# Patient Record
Sex: Female | Born: 2000 | Race: White | Hispanic: No | Marital: Single | Smoking: Current every day smoker
Health system: Southern US, Community
[De-identification: ages and names within clinical notes are randomized; demographics above are authoritative.]

## PROBLEM LIST (undated history)

## (undated) DIAGNOSIS — F329 Major depressive disorder, single episode, unspecified: Secondary | ICD-10-CM

## (undated) DIAGNOSIS — N94819 Vulvodynia, unspecified: Secondary | ICD-10-CM

## (undated) DIAGNOSIS — R000 Tachycardia, unspecified: Secondary | ICD-10-CM

## (undated) DIAGNOSIS — F419 Anxiety disorder, unspecified: Secondary | ICD-10-CM

## (undated) DIAGNOSIS — F32A Depression, unspecified: Secondary | ICD-10-CM

## (undated) DIAGNOSIS — G43909 Migraine, unspecified, not intractable, without status migrainosus: Secondary | ICD-10-CM

## (undated) DIAGNOSIS — I951 Orthostatic hypotension: Secondary | ICD-10-CM

## (undated) DIAGNOSIS — K219 Gastro-esophageal reflux disease without esophagitis: Secondary | ICD-10-CM

## (undated) DIAGNOSIS — T50901A Poisoning by unspecified drugs, medicaments and biological substances, accidental (unintentional), initial encounter: Secondary | ICD-10-CM

## (undated) DIAGNOSIS — K2970 Gastritis, unspecified, without bleeding: Secondary | ICD-10-CM

## (undated) DIAGNOSIS — L709 Acne, unspecified: Secondary | ICD-10-CM

## (undated) DIAGNOSIS — R42 Dizziness and giddiness: Secondary | ICD-10-CM

## (undated) DIAGNOSIS — N946 Dysmenorrhea, unspecified: Secondary | ICD-10-CM

## (undated) HISTORY — DX: Dizziness and giddiness: R42

## (undated) HISTORY — DX: Migraine, unspecified, not intractable, without status migrainosus: G43.909

## (undated) HISTORY — DX: Vulvodynia, unspecified: N94.819

## (undated) HISTORY — DX: Orthostatic hypotension: I95.1

## (undated) HISTORY — DX: Gastro-esophageal reflux disease without esophagitis: K21.9

## (undated) HISTORY — DX: Acne, unspecified: L70.9

## (undated) HISTORY — DX: Poisoning by unspecified drugs, medicaments and biological substances, accidental (unintentional), initial encounter: T50.901A

## (undated) HISTORY — DX: Dysmenorrhea, unspecified: N94.6

## (undated) HISTORY — DX: Major depressive disorder, single episode, unspecified: F32.9

## (undated) HISTORY — DX: Tachycardia, unspecified: R00.0

## (undated) HISTORY — DX: Depression, unspecified: F32.A

## (undated) HISTORY — DX: Gastritis, unspecified, without bleeding: K29.70

## (undated) HISTORY — DX: Anxiety disorder, unspecified: F41.9

---
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
## 2019-02-24 ENCOUNTER — Encounter: Payer: Self-pay | Admitting: Family Medicine

## 2019-02-24 ENCOUNTER — Telehealth: Payer: Self-pay | Admitting: *Deleted

## 2019-02-24 NOTE — Telephone Encounter (Signed)
Patient called and left me a message on my voicemail asking if you were the correct provider that would be able to provide the information on her ADHD. She is registering for disability services for her ADHD at [employer]. She needs to provide documentation of medication, diagnosis, etc and what accomodations she is asking for. She said [facility] to [facility] but had her records sent here and you did take over her care-please advise.

## 2019-02-24 NOTE — Telephone Encounter (Signed)
I would be the correct person.  As far as I know, she doesn't have other accommodations, just the medications, which were discussed in detail at her last visit.

## 2019-02-24 NOTE — Telephone Encounter (Signed)
Patient sent form, I am bringing back to you.

## 2019-02-25 ENCOUNTER — Other Ambulatory Visit: Payer: Self-pay | Admitting: Family Medicine

## 2019-02-25 ENCOUNTER — Encounter: Payer: Self-pay | Admitting: Family Medicine

## 2019-02-25 DIAGNOSIS — F988 Other specified behavioral and emotional disorders with onset usually occurring in childhood and adolescence: Secondary | ICD-10-CM

## 2019-02-25 MED ORDER — AMPHETAMINE-DEXTROAMPHET ER 5 MG PO CP24: 5.0000 mg | ORAL_CAPSULE | Freq: Every day | ORAL | 0 refills | Status: DC

## 2019-02-25 MED ORDER — AMPHETAMINE-DEXTROAMPHET ER 10 MG PO CP24: 10.0000 mg | ORAL_CAPSULE | Freq: Every day | ORAL | 0 refills | Status: DC

## 2019-02-25 NOTE — Telephone Encounter (Signed)
Adderall does not show up under PDMP.

## 2019-02-25 NOTE — Telephone Encounter (Signed)
Correction, I was able to get into the site.  Last refill 11/20/18 #30 Dextroamp-Amphet ER 10 mg

## 2019-02-25 NOTE — Telephone Encounter (Signed)
Shelley Foster, please look into this as I do not have access to PDMP review

## 2019-02-25 NOTE — Telephone Encounter (Signed)
Refills were done. But for some reason, her pharmacy doesn't show up in the [place] review.  Any idea why?  This seems important that I have this information, and it is a [facility], so I'm not sure why it doesn't show up.  Thanks for looking into this or forwarding to someone who can.

## 2019-02-25 NOTE — Telephone Encounter (Signed)
Is this okay to refill? 

## 2019-05-05 ENCOUNTER — Other Ambulatory Visit: Payer: Self-pay

## 2019-05-05 ENCOUNTER — Encounter: Payer: Self-pay | Admitting: Family Medicine

## 2019-05-05 ENCOUNTER — Ambulatory Visit: Payer: BC Managed Care – PPO | Admitting: Family Medicine

## 2019-05-05 VITALS — BP 110/70 | HR 88 | Temp 96.0°F | Ht 63.0 in | Wt 103.6 lb

## 2019-05-05 DIAGNOSIS — Z23 Encounter for immunization: Secondary | ICD-10-CM

## 2019-05-05 DIAGNOSIS — F988 Other specified behavioral and emotional disorders with onset usually occurring in childhood and adolescence: Secondary | ICD-10-CM

## 2019-05-05 DIAGNOSIS — R634 Abnormal weight loss: Secondary | ICD-10-CM

## 2019-05-05 DIAGNOSIS — R636 Underweight: Secondary | ICD-10-CM

## 2019-05-05 DIAGNOSIS — R350 Frequency of micturition: Secondary | ICD-10-CM

## 2019-05-05 DIAGNOSIS — Z681 Body mass index (BMI) 19 or less, adult: Secondary | ICD-10-CM

## 2019-05-05 DIAGNOSIS — F1290 Cannabis use, unspecified, uncomplicated: Secondary | ICD-10-CM

## 2019-05-05 LAB — POCT URINALYSIS DIP (PROADVANTAGE DEVICE)
Bilirubin, UA: NEGATIVE
Blood, UA: NEGATIVE
Glucose, UA: NEGATIVE mg/dL
Ketones, POC UA: NEGATIVE mg/dL
Leukocytes, UA: NEGATIVE
Nitrite, UA: NEGATIVE
Protein Ur, POC: NEGATIVE mg/dL
Specific Gravity, Urine: 1.015
Urobilinogen, Ur: NEGATIVE
pH, UA: 6 (ref 5.0–8.0)

## 2019-05-05 NOTE — Progress Notes (Signed)
Chief Complaint  Patient presents with  . Weight Loss    having issues with weightloss, thinks adderall was contributing. Started Ophelia Charter PM (new med) , Dr. Marisue Brooklyn rxing.     Patient presents with concern of weight loss.  She thinks she has decreased appetite, and forgets to eat.  Sometimes she gets distracted (goes to get something to eat, gets distracted and forgets), related to ADD. She noticed decrease in appetite a lot with the adderall. She was just recently changed to [place] PM, taken for a week, and hasn't been in class yet. She started on lowest dose, may need to increase. This is taken the night before, and so far hasn't upset her stomach like the adderall did.  She has adderall (and other older ADD meds) on hand, in case she forgets to take it the night before (ie if she has exam, and didn't remember to take medicine the night before).   She has been home over break, and reports moods have been much better than other times when home for extended periods--helps knowing she has a date to return to school.  She adopted a dog, who loves being home and getting more attention. She will continue to take classes online, lives in apartment. She has been more active since getting her dog--walking up and down 4 flights of stairs, walking between 30-60 minutes most days.  She previously used to walk a lot all over campus, but was less active until she got her dog.  Routine at home is different, only eat meals together infrequently, as sometimes dinner isn't ready until late, when she has already eaten a sandwich earlier.    At school, she was getting meals to go (from sorority)--by the time she gets the food home, it isn't as hot, doesn't taste as good. She has gotten into a routine of eating in the morning--eats yogurt or oatmeal if has an appetite, if not, she will have a protein shake until her stomach feels better to eat more. She is trying to have a regular routine of eating when she feeds  her dog (reminds her to feed herself too).  Has nuts and snacks at home ready to eat, even if she isn't hungry or craving anything.  Some change to bowels--sometimes will have 5 bowel movements in the morning, some small, some large.  Pattern has been stable for a while. Has IUD, rare spotting. No changes to hair, skin, nails, moods  She voids frequently--drinks a lot of water, and Diet Coke (followed by water). Smokes marijuana daily. Helps with appetite.  PMH, PSH, SH reviewed  Outpatient Encounter Medications as of 05/05/2019  Medication Sig Note  . Ascorbic Acid (VITAMIN C) 100 MG CHEW Chew 1 each by mouth daily.   . Calcium-Phosphorus-Vitamin D (CALCIUM GUMMIES PO) Take 1 each by mouth daily.   . Ferrous Sulfate (IRON) 325 (65 Fe) MG TABS Take 1 tablet by mouth daily.   Marland Kitchen FIBER ADULT GUMMIES PO Take 1 each by mouth daily.   . JORNAY PM 20 MG CP24 Take 1 capsule by mouth at bedtime.   Marland Kitchen levonorgestrel (MIRENA) 20 MCG/24HR IUD 1 each by Intrauterine route once.   . Multiple Vitamins-Minerals (MULTIVITAMIN WITH MINERALS) tablet Take 1 tablet by mouth daily.   . Probiotic Product (PROBIOTIC DAILY PO) Take 1 capsule by mouth daily.   . Amphetamine Sulfate (EVEKEO) 10 MG TABS Take 1 tablet by mouth as needed.   Marland Kitchen amphetamine-dextroamphetamine (ADDERALL XR) 10 MG 24 hr  capsule Take 1 capsule (10 mg total) by mouth daily. (Patient not taking: Reported on 05/05/2019)   . amphetamine-dextroamphetamine (ADDERALL XR) 5 MG 24 hr capsule Take 1 capsule (5 mg total) by mouth daily. (Patient not taking: Reported on 05/05/2019)   . naproxen sodium (ANAPROX DS) 550 MG tablet 1 tab po q 12 hours prn pain (Patient not taking: Reported on 05/05/2019) 09/15/2015: Uses prn menstrual cramps  . [DISCONTINUED] amphetamine-dextroamphetamine (ADDERALL XR) 10 MG 24 hr capsule Take 1 capsule (10 mg total) by mouth daily.   . [DISCONTINUED] amphetamine-dextroamphetamine (ADDERALL XR) 10 MG 24 hr capsule Take 1 capsule (10  mg total) by mouth daily.   . [DISCONTINUED] amphetamine-dextroamphetamine (ADDERALL XR) 5 MG 24 hr capsule Take 1 capsule (5 mg total) by mouth daily.   . [DISCONTINUED] amphetamine-dextroamphetamine (ADDERALL XR) 5 MG 24 hr capsule Take 1 capsule (5 mg total) by mouth daily.    No facility-administered encounter medications on file as of 05/05/2019.   Allergies  Allergen Reactions  . Lactase Nausea Only   ROS: no fever, chills, URI symptoms, fatigue, hair/skin/nail changes.  Bowels per HPI.  No palpitations/tachycardia, chest pain.  +decreased appetite at times.   +ADD. Denies depression/anxiety.  See HPI   PHYSICAL EXAM:  BP 110/70   Pulse 88   Temp (!) 96 F (35.6 C) (Other (Comment))   Ht 5\' 3"  (1.6 m)   Wt 103 lb 9.6 oz (47 kg)   BMI 18.35 kg/m   Wt Readings from Last 3 Encounters:  05/05/19 103 lb 9.6 oz (47 kg) (7 %, Z= -1.51)*  12/03/18 129 lb 12.8 oz (58.9 kg) (55 %, Z= 0.12)*  11/20/18 131 lb (59.4 kg) (57 %, Z= 0.18)*   * Growth percentiles are based on CDC (Girls, 2-20 Years) data.   Pleasant, well-appearing, thin female, in good spirits, in no distress HEENT: conjunctiva and sclera are clear, EOMI. Wearing mask Neck; no lymphadenopathy, thyromegaly or mass Heart: regular rate and rhythm, no murmur Lungs: clear bilaterally Back: No spinal or CVA tenderness Abdomen: soft, nontender, no mass Extremities: no edema Psych: normal mood, affect, hygiene and grooming Neuro: alert and oriented, normal gait.  ASSESSMENT/PLAN:   Weight loss - suspect related to SE of meds, inadequate intake. r/o thyroid dz. Given polyuria, r/o DM - Plan: TSH, Glucose  Urinary frequency - suspect related to water and caffeine intake. given wt loss, r/o glucosuria/DM - Plan: Glucose, POCT Urinalysis DIP (Proadvantage Device)  Attention deficit disorder (ADD) without hyperactivity - meds changed per Dr. Johnnye Sima, to be titrated once classes start  Need for influenza vaccination -  Plan: Flu Vaccine QUAD 6+ mos PF IM (Fluarix Quad PF)  Body mass index (BMI) 19.9 or less, adult  Underweight - counseled extensively re: adequate caloric intake, healthy diet, reminders to eat even if not hungry. Weight-bearing exercise recommended  Marijuana use, continuous - not encouraged to use for appetite at this point (not reason she uses, but helps)   30 min visit, more than 1/2 spent counseling. Weight bearing exercise encouraged (no longer walking around campus with heavy backpack), discussed potential increased risk for weaker bones.  Denies any body image issues. Discussed goal weight (110-130) Pt will be returning to school, so unable to arrange for f/u.

## 2019-05-05 NOTE — Patient Instructions (Signed)
We are testing your thyroid and for diabetes today. Please be sure to eat every 2-3 hours, whether or not you feel hungry. Have it be nutritious foods, including protein and carbs, and fruits, vegetables. Use the shakes if you don't eat much, or as additional supplements (ie afternoon snack). Try and get weight-bearing exercise (you may have lost some muscle since not carrying a backpack, and also because your bones are at high risk for thinning when you're underweight).

## 2019-05-06 LAB — GLUCOSE, RANDOM: Glucose: 73 mg/dL (ref 65–99)

## 2019-05-06 LAB — TSH: TSH: 1.09 u[IU]/mL (ref 0.450–4.500)

## 2019-07-15 ENCOUNTER — Encounter: Payer: Self-pay | Admitting: Family Medicine

## 2019-07-15 NOTE — Patient Instructions (Addendum)
Have a happy birthday!!!  We are checking your blood today, and giving you specimen containers to evaluate the stool for eggs and worms/parasites. Not sure if the photo was truly a worm or not, and if so, which one.  I'm giving information on tapeworm, just since you have lost weight (though you haven't continued to lose weight, so I'm thinking maybe there are other contributing factors). Based on the stool studies, we will decide of an anti-parasitic medication is needed.  Please keep [place] updated on any further abnormalities in your stool that you note, and save it if you see it again.    Tapeworm Infection Tapeworms are parasites that can live in your intestines. When eggs from a tapeworm are consumed, the eggs develop into a young tapeworm (larva) and eventually an adult tapeworm inside of the body. An adult tapeworm can grow very long and can live inside of a human for many years. Tapeworm infection is rare in the [place]. There are several types of tapeworms. Most tapeworm infections cause only mild symptoms and are limited to the intestines. One type of tapeworm, called a pork tapeworm (Taenia solium or T. solium), can cause a more serious infection (cysticercosis). In this type of infection, tapeworm larvae can spread through the body and form cysts in areas such as the eyes, skin, muscle tissue, heart, and brain. What are the causes? This condition is caused by:  Eating beef, pork, or fish that is raw or has not been cooked well enough.  Drinking water that is contaminated with tapeworm eggs.  Eating food that is contaminated with tapeworm eggs. What increases the risk? You are more likely to develop this condition if:  You live in or travel to places where livestock roam freely, such as rural, developing countries.  You work with animals or are exposed to animals.  You live in a household with someone who has a tapeworm infection.  You eat food that has been prepared by  someone with a tapeworm infection. What are the signs or symptoms? In some cases, there are no symptoms. If you have symptoms, they may include:  Pain in the abdomen.  Nausea.  Loss of appetite.  Weight loss.  Worm segments in your stool (feces).  Diarrhea. Symptoms of cysticercosis vary depending on where the cysts form in your body. Symptoms may include:  Feeling lumps under your skin.  Headache.  Seizure.  Confusion.  Loss of vision.  Muscle weakness.  Balance problems.  Eye pain. How is this diagnosed? This condition may be diagnosed based on:  Your symptoms.  Your travel history.  Your diet history.  Blood tests.  Stool tests.  Tests on the fluid that surrounds the brain and spinal cord (cerebrospinal fluid, CSF). The fluid is removed with a needle during a spinal tap (lumbar puncture).  Imaging tests, such as a CT scan or MRI. How is this treated? Treatment for this condition depends on the type of tapeworm and your symptoms. Most often, this condition is treated with antiparasitic medicine to kill the tapeworms. Treatment for cysticercosis depends on the number of cysts and the symptoms you have. Treatment may include:  Medicines to: ? Kill tapeworm larvae or eggs. ? Decrease swelling (steroids). ? Prevent seizures (anti-epileptics).  Surgery to remove cysts. Follow these instructions at home:   Take over-the-counter and prescription medicines only as told by your health care provider.  If you were prescribed an antiparasitic medicine, take it as told by your health care  provider. Do not stop taking the antiparasitic even if you start to feel better.  Keep all follow-up visits as told by your health care provider. This is important.  Wash your hands often with soap and water. If soap and water are not available, use hand sanitizer. How is this prevented?  Do not eat raw or undercooked fish or meat. Cook fish and meat according to food  safety guidelines. Use a meat thermometer to make sure that fish and meat are cooked to the recommended temperatures.  Wash your hands with soap and water: ? After you use the toilet. ? Before you handle or prepare food. ? After you handle or prepare food.  Freeze meat for 12 hours before cooking it to help prevent tapeworm infection. Only eat raw fish (such as sushi) that has been previously frozen.  Before you eat raw fruits and vegetables: ? Wash them in boiled, bottled, or treated water. ? Peel them.  When traveling in developing countries: ? Make sure you only drink water that is bottled or treated. ? Do not eat or drink anything that may be contaminated, including beverages with ice cubes that may have been made from unboiled or untreated water. ? Do not eat raw foods that have been washed in unboiled tap water. ? It is safe to drink bottled or canned beverages, such as carbonated beverages, teas, pasteurized fruit drinks, or steaming hot beverages. Contact a health care provider if you:  Still have symptoms of tapeworm infection after your treatment is complete.  Develop any new symptoms. Get help right away if you:  Have a seizure.  Have sudden vision loss.  Feel light-headed or you faint.  Become confused. Summary  Tapeworms are parasites that can live in your intestines. Tapeworms can live in a human body for many years.  Tapeworms develop from tapeworm eggs that are consumed. This can occur from eating beef, pork, or fish that is raw or has not been cooked well enough, or from eating food or drinking water that is contaminated with tapeworm eggs.  Most tapeworm infections cause only mild symptoms and are limited to the intestines. One type of tapeworm can cause a more serious infection (cysticercosis) where the larvae spread through the body and form cysts.  Most often, this condition is treated with antiparasitic medicine to kill the tapeworms. This information is  not intended to replace advice given to you by your health care provider. Make sure you discuss any questions you have with your health care provider. Document Revised: 08/08/2018 Document Reviewed: 05/25/2017 Elsevier Patient Education  2020 [employer].

## 2019-07-15 NOTE — Progress Notes (Signed)
Chief Complaint  Patient presents with  . Consult    found worm in her bowel movement yesterday. Had BM today and was normal. Was seen in [place] at [facility] 2/28 and 3/5 for low grade fever and fatigue. Had GI symptoms only after given abx. Has had mucousy BM's for alst few months.     Patient presents for evaluation of possible worm in her stool.  See photo sent via MyChart yesterday, which looks like a worm on toilet paper.  Given that she has had unexplained weight loss (other than related to ADHD meds and possibly inadequate intake), I felt like we should further evaluate this in the context of possible worm (tapeworm vs other).  She happens to be home from college in [place] this week.  She reports recent illness--went to [facility] 2/28 with low grade fever (100.5 or less), fatigue, hot/cold. Negative covid and flu tests.  No other URI symptoms.  Given rx for 10d of doxy, which caused nausea and stomach upset . Went to a different UC on 3/5, retested for COVID, flu, mono, strep, checked blood and urine--all was reportedly normal. Told she could stop ABX after 7 days.  Given nausea med and told to take claritin (had some PND). She admits that she smoked a little more marijuana--may have contributed to ST, but that had helped her stomach and nausea.  Mother came and got her, brought her home. Since home, she has rested, feels better.  Still more tired than normal. Since passing the foreign object in yesterday's stool, denies further problems.  No further abnormal stools.  Denies abdominal pain (occasionally feels bloated, if she eats the wrong thing, which she relates to not having her gall bladder anymore). Denies fever/chills (resolved).  Checking weights at home; at home (undressed) ranges 100-102, trying to eat more, even if not as healthy. Hasn't had weight loss since her last visit.  Dog owner--yes Ingestion of contaminated water? No, drinks filtered or bottled water Eating/handling undercooked fish or  crustaceans? Only eats good sushi Eating/handling undercooked meat? No No travel or camping.   PMH, PSH, SH reviewed  Outpatient Encounter Medications as of 07/16/2019  Medication Sig Note  . Ascorbic Acid (VITAMIN C) 100 MG CHEW Chew 1 each by mouth daily.   . Calcium-Phosphorus-Vitamin D (CALCIUM GUMMIES PO) Take 1 each by mouth daily.   Marland Kitchen FIBER ADULT GUMMIES PO Take 1 each by mouth daily.   . JORNAY PM 100 MG CP24 Take 1 capsule by mouth at bedtime.   Marland Kitchen levonorgestrel (MIRENA) 20 MCG/24HR IUD 1 each by Intrauterine route once.   . methylphenidate (METADATE CD) 40 MG CR capsule Take 40 mg by mouth every morning.   . Multiple Vitamins-Minerals (MULTIVITAMIN WITH MINERALS) tablet Take 1 tablet by mouth daily.   . Probiotic Product (PROBIOTIC DAILY PO) Take 1 capsule by mouth daily.   . Amphetamine Sulfate (EVEKEO) 10 MG TABS Take 1 tablet by mouth as needed.   . doxycycline (MONODOX) 100 MG capsule Take 100 mg by mouth every 12 (twelve) hours.   . Ferrous Sulfate (IRON) 325 (65 Fe) MG TABS Take 1 tablet by mouth daily.   . naproxen sodium (ANAPROX DS) 550 MG tablet 1 tab po q 12 hours prn pain (Patient not taking: Reported on 05/05/2019) 09/15/2015: Uses prn menstrual cramps  . ondansetron (ZOFRAN-ODT) 4 MG disintegrating tablet Take 4 mg by mouth every 6 (six) hours as needed.   . [DISCONTINUED] amphetamine-dextroamphetamine (ADDERALL XR) 10 MG 24 hr capsule  Take 1 capsule (10 mg total) by mouth daily. (Patient not taking: Reported on 05/05/2019)   . [DISCONTINUED] amphetamine-dextroamphetamine (ADDERALL XR) 5 MG 24 hr capsule Take 1 capsule (5 mg total) by mouth daily. (Patient not taking: Reported on 05/05/2019)   . [DISCONTINUED] JORNAY PM 20 MG CP24 Take 1 capsule by mouth at bedtime.    No facility-administered encounter medications on file as of 07/16/2019.   Allergies  Allergen Reactions  . Lactase Nausea Only   ROS: recent illness with fever, fatigue, nausea.  Currently feels well,  some residual fatigue. No nausea, vomiting, diarrhea, bleeding, bruising, rash, URI complaints or other concerns.   PHYSICAL EXAM:  BP 100/70   Pulse 68   Temp (!) 97.3 F (36.3 C) (Other (Comment))   Ht 5' 3.5" (1.613 m)   Wt 104 lb 3.2 oz (47.3 kg)   BMI 18.17 kg/m   Wt Readings from Last 3 Encounters:  07/16/19 104 lb 3.2 oz (47.3 kg) (7 %, Z= -1.47)*  05/05/19 103 lb 9.6 oz (47 kg) (7 %, Z= -1.51)*  12/03/18 129 lb 12.8 oz (58.9 kg) (55 %, Z= 0.12)*   * Growth percentiles are based on CDC (Girls, 2-20 Years) data.   Well-appearing, pleasant, thin female, in no distress HEENT: conjunctiva and sclera are clear, anicteric Neck: no lymphadenopathy, thyromegaly or mass Heart: regular rate and rhythm Lungs: clear bilaterally Abdomen: normal bowel sounds, soft, nontender, no mass Extremities; no edema Skin: normal turgor, no rash Psych: normal mood, affect, hygiene and grooming Neuro: alert and oriented, normal gait   ASSESSMENT/PLAN:  Worms in stool - Recent sginificant wt loss, now stable: check for O&P prior to presumptive treatment, ?tapeworm vs other. - Plan: CBC with Differential/Platelet, Comprehensive metabolic panel, Ova and Parasite Examination, Ova and Parasite Examination, Ova and Parasite Examination, Ova and Parasite Examination   Stool for O&P x 3 CBC and c-met

## 2019-07-16 ENCOUNTER — Other Ambulatory Visit: Payer: Self-pay

## 2019-07-16 ENCOUNTER — Encounter: Payer: Self-pay | Admitting: Family Medicine

## 2019-07-16 ENCOUNTER — Ambulatory Visit: Payer: BC Managed Care – PPO | Admitting: Family Medicine

## 2019-07-16 VITALS — BP 100/70 | HR 68 | Temp 97.3°F | Ht 63.5 in | Wt 104.2 lb

## 2019-07-16 DIAGNOSIS — B839 Helminthiasis, unspecified: Secondary | ICD-10-CM

## 2019-07-17 ENCOUNTER — Other Ambulatory Visit: Payer: BC Managed Care – PPO

## 2019-07-17 LAB — COMPREHENSIVE METABOLIC PANEL
ALT: 16 IU/L (ref 0–32)
AST: 19 IU/L (ref 0–40)
Albumin/Globulin Ratio: 1.8 (ref 1.2–2.2)
Albumin: 4.8 g/dL (ref 3.9–5.0)
Alkaline Phosphatase: 73 IU/L (ref 39–117)
BUN/Creatinine Ratio: 15 (ref 9–23)
BUN: 9 mg/dL (ref 6–20)
Bilirubin Total: 0.4 mg/dL (ref 0.0–1.2)
CO2: 26 mmol/L (ref 20–29)
Calcium: 9.9 mg/dL (ref 8.7–10.2)
Chloride: 100 mmol/L (ref 96–106)
Creatinine, Ser: 0.6 mg/dL (ref 0.57–1.00)
GFR calc Af Amer: 153 mL/min/{1.73_m2} (ref >59)
GFR calc non Af Amer: 133 mL/min/{1.73_m2} (ref >59)
Globulin, Total: 2.6 g/dL (ref 1.5–4.5)
Glucose: 77 mg/dL (ref 65–99)
Potassium: 5 mmol/L (ref 3.5–5.2)
Sodium: 139 mmol/L (ref 134–144)
Total Protein: 7.4 g/dL (ref 6.0–8.5)

## 2019-07-17 LAB — CBC WITH DIFFERENTIAL/PLATELET
Basophils Absolute: 0.1 10*3/uL (ref 0.0–0.2)
Basos: 1 %
EOS (ABSOLUTE): 0.1 10*3/uL (ref 0.0–0.4)
Eos: 0 %
Hematocrit: 45.4 % (ref 34.0–46.6)
Hemoglobin: 15.3 g/dL (ref 11.1–15.9)
Immature Grans (Abs): 0 10*3/uL (ref 0.0–0.1)
Immature Granulocytes: 0 %
Lymphocytes Absolute: 2.2 10*3/uL (ref 0.7–3.1)
Lymphs: 17 %
MCH: 31.5 pg (ref 26.6–33.0)
MCHC: 33.7 g/dL (ref 31.5–35.7)
MCV: 93 fL (ref 79–97)
Monocytes Absolute: 0.9 10*3/uL (ref 0.1–0.9)
Monocytes: 7 %
Neutrophils Absolute: 9.3 10*3/uL — ABNORMAL HIGH (ref 1.4–7.0)
Neutrophils: 75 %
Platelets: 332 10*3/uL (ref 150–450)
RBC: 4.86 x10E6/uL (ref 3.77–5.28)
RDW: 11.7 % (ref 11.7–15.4)
WBC: 12.5 10*3/uL — ABNORMAL HIGH (ref 3.4–10.8)

## 2019-07-17 NOTE — Addendum Note (Signed)
Addended by: Sheliah Plane on: 07/17/2019 02:56 PM   Modules accepted: Orders

## 2019-07-21 LAB — OVA AND PARASITE EXAMINATION

## 2019-07-22 ENCOUNTER — Encounter: Payer: Self-pay | Admitting: Family Medicine

## 2019-07-22 LAB — OVA AND PARASITE EXAMINATION

## 2019-08-02 ENCOUNTER — Encounter: Payer: Self-pay | Admitting: Family Medicine

## 2019-09-10 ENCOUNTER — Encounter: Payer: Self-pay | Admitting: Family Medicine

## 2019-11-15 ENCOUNTER — Encounter: Payer: Self-pay | Admitting: Family Medicine

## 2019-12-10 ENCOUNTER — Encounter: Payer: Self-pay | Admitting: Family Medicine

## 2019-12-10 ENCOUNTER — Telehealth: Payer: BC Managed Care – PPO | Admitting: Family Medicine

## 2019-12-10 ENCOUNTER — Other Ambulatory Visit: Payer: Self-pay

## 2019-12-10 VITALS — Temp 97.8°F | Ht 63.5 in | Wt 110.0 lb

## 2019-12-10 DIAGNOSIS — R110 Nausea: Secondary | ICD-10-CM | POA: Diagnosis not present

## 2019-12-10 DIAGNOSIS — K219 Gastro-esophageal reflux disease without esophagitis: Secondary | ICD-10-CM | POA: Diagnosis not present

## 2019-12-10 DIAGNOSIS — J069 Acute upper respiratory infection, unspecified: Secondary | ICD-10-CM

## 2019-12-10 MED ORDER — ONDANSETRON HCL 4 MG PO TABS: 4.0000 mg | ORAL_TABLET | Freq: Three times a day (TID) | ORAL | 0 refills | Status: DC | PRN

## 2019-12-10 NOTE — Progress Notes (Signed)
Start time: 12:16 End time: 12:46  Virtual Visit via Video Note  I connected with Shelley Foster on 12/10/19 by a video enabled telemedicine application and verified that I am speaking with the correct person using two identifiers.  Location: Patient: in her apartment in [place] Provider: office   I discussed the limitations of evaluation and management by telemedicine and the availability of in person appointments. The patient expressed understanding and agreed to proceed.  History of Present Illness:  Chief Complaint  Patient presents with   Nasal Congestion    VIRTUAL started last night and stuffy nose is one-sided. Mucus is yellow. No coughing or chest congestion. Doesn't think she has any fever-will check. Body hurts-but thinks from [employer] recruitment. Is taking ondansetron for nausea and it is helping tremendously-she is almost and would like new rx with new directions for more tablets.    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  Wearing masks inside, but eating together and sometimes off while getting ready. When she woke up from a nap yesterday, her nose was stuffy, and got worse through the evening.  Nasal drainage is clear at first, but mainly yellow.  Mainly from the right side.  The left side is dry, slightly bloody drainage. Having pressure in her cheeks and forehead and ears, R>L. Yesterday morning she had a sore throat (thinks vocal fatigue from yelling). She stayed home today due to not feeling well. +sick contacts in sorority, believes all COVID tests have been negative. She has been vaccinated.   Weight is better since back at school, where she has more control over her food. (last visit here was for weight loss).  She states she was seen at an urgent care last week for nausea, diagnosed with reflux.   She was prescribed omeprazole 20mg , taking at 8pm.  Nausea is usually earlier in the day Using zofran regularly, which is very helpful in treating her  nausea Given #18, and is requesting more, to get her through rush (a lot of jumping, bounching, which makes things worse). Father with reflux too  She states she has been doing some research, isn't sure if her nausea is from reflux, thinks maybe it is from POTS. 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 She has been eating more salty stuff, drinking gatorade, candies She feels that this has been treating dizziness and lighthead, so that hasn't been bad, just the nausea.  She is asking for zofran just during rush    PMH, PSH, SH reviewed  Outpatient Encounter Medications as of 12/10/2019  Medication Sig Note   Ascorbic Acid (VITAMIN C) 100 MG CHEW Chew 1 each by mouth daily.    Calcium-Phosphorus-Vitamin D (CALCIUM GUMMIES PO) Take 1 each by mouth daily.    Ferrous Sulfate (IRON) 325 (65 Fe) MG TABS Take 1 tablet by mouth daily.    FIBER ADULT GUMMIES PO Take 1 each by mouth daily.    JORNAY PM 100 MG CP24 Take 1 capsule by mouth at bedtime.    levonorgestrel (MIRENA) 20 MCG/24HR IUD 1 each by Intrauterine route once.    methylphenidate (METADATE CD) 40 MG CR capsule Take 40 mg by mouth every morning.    Multiple Vitamins-Minerals (MULTIVITAMIN WITH MINERALS) tablet Take 1 tablet by mouth daily.    naproxen sodium (ANAPROX DS) 550 MG tablet 1 tab po q 12 hours prn pain 09/15/2015: Uses prn menstrual cramps   omeprazole (PRILOSEC) 20 MG capsule Take 20 mg by mouth daily.  ondansetron (ZOFRAN-ODT) 4 MG disintegrating tablet Take 4 mg by mouth every 6 (six) hours as needed.    Probiotic Product (PROBIOTIC DAILY PO) Take 1 capsule by mouth daily.    Amphetamine Sulfate (EVEKEO) 10 MG TABS Take 1 tablet by mouth as needed. (Patient not taking: Reported on 12/10/2019)    ondansetron (ZOFRAN) 4 MG tablet Take 1 tablet (4 mg total) by mouth every 8 (eight) hours as needed for nausea or vomiting.    [DISCONTINUED] doxycycline (MONODOX) 100 MG capsule Take 100 mg by mouth  every 12 (twelve) hours.    No facility-administered encounter medications on file as of 12/10/2019.   Allergies  Allergen Reactions   Lactase Nausea Only   ROS:  No fever. + URI symptoms as reported above. No cough, shortness of breath, chest pain.  No syncope or significant dizziness.  +nausea per HPI.  No bleeding, rash, change in bowels, or other concerns, except as noted above.    Observations/Objective:  Temp 97.8 F (36.6 C) (Oral)    Ht 5' 3.5" (1.613 m)    Wt 110 lb (49.9 kg)    BMI 19.18 kg/m    Wt Readings from Last 3 Encounters:  12/10/19 110 lb (49.9 kg)  07/16/19 104 lb 3.2 oz (47.3 kg) (7 %, Z= -1.47)*  05/05/19 103 lb 9.6 oz (47 kg) (7 %, Z= -1.51)*   * Growth percentiles are based on CDC (Girls, 2-20 Years) data.   Pleasant, well-appearing female, sounds slightly congested. There is no coughing, she appears comfortable, and in good spirits She is alert, oriented, cranial nerves are grossly intact. Exam is limited due to virtual nature of the visit   Assessment and Plan:  Upper respiratory tract infection, unspecified type - COVID test today. Supportive measures reviewed in detail  Nausea - Ddx reviewed--viral vs PND contributing vs GERD. Doubt POTS, given lack of other sx. Zofran not for longterm - Plan: ondansetron (ZOFRAN) 4 MG tablet  Gastroesophageal reflux disease, unspecified whether esophagitis present - counseled in detail about diet (things she is doing to help with POTS may be exacerbating GERD).    COVID test today Drink lots of water. Avoid decongestants while taking ADD meds (okay to take, but not the same day). Continue sinus rinses (neti-pot or sinus rinses) once or twice daily.  Double up on the prilosec (take 1 twice daily, vs 2 prior to dinner--whichever seems to be more effective).  Cut back on citrus, alcohol, caffeine, chocolate.    Pharmacist said insurance won't cover more of the 4mg  ODT, and to either change to tablet vs 8mg   dose.  Changed to tablet, since not vomiting, and can tolerate sip of water. Encouraged her to use sparingly, only when needed for significant nausea/vomiting.   GERD handout   Follow Up Instructions:    I discussed the assessment and treatment plan with the patient. The patient was provided an opportunity to ask questions and all were answered. The patient agreed with the plan and demonstrated an understanding of the instructions.   The patient was advised to call back or seek an in-person evaluation if the symptoms worsen or if the condition fails to improve as anticipated.  I provided 30 minutes of video face-to-face time during this encounter. Additional time spent in chart review and documentation   , MD

## 2019-12-10 NOTE — Patient Instructions (Signed)
COVID test today Drink lots of water. Avoid decongestants while taking ADD meds (okay to take, but not the same day). Continue sinus rinses (neti-pot or sinus rinses) once or twice daily.  Double up on the prilosec (take 1 twice daily, vs 2 prior to dinner--whichever seems to be more effective).  Cut back on citrus, alcohol, caffeine, chocolate.   Food Choices for Gastroesophageal Reflux Disease, Adult When you have gastroesophageal reflux disease (GERD), the foods you eat and your eating habits are very important. Choosing the right foods can help ease the discomfort of GERD. Consider working with a diet and nutrition specialist (dietitian) to help you make healthy food choices. What general guidelines should I follow?  Eating plan  Choose healthy foods low in fat, such as fruits, vegetables, whole grains, low-fat dairy products, and lean meat, fish, and poultry.  Eat frequent, small meals instead of three large meals each day. Eat your meals slowly, in a relaxed setting. Avoid bending over or lying down until 2-3 hours after eating.  Limit high-fat foods such as fatty meats or fried foods.  Limit your intake of oils, butter, and shortening to less than 8 teaspoons each day.  Avoid the following: ? Foods that cause symptoms. These may be different for different people. Keep a food diary to keep track of foods that cause symptoms. ? Alcohol. ? Drinking large amounts of liquid with meals. ? Eating meals during the 2-3 hours before bed.  Cook foods using methods other than frying. This may include baking, grilling, or broiling. Lifestyle  Maintain a healthy weight. Ask your health care provider what weight is healthy for you. If you need to lose weight, work with your health care provider to do so safely.  Exercise for at least 30 minutes on 5 or more days each week, or as told by your health care provider.  Avoid wearing clothes that fit tightly around your waist and chest.  Do  not use any products that contain nicotine or tobacco, such as cigarettes and e-cigarettes. If you need help quitting, ask your health care provider.  Sleep with the head of your bed raised. Use a wedge under the mattress or blocks under the bed frame to raise the head of the bed. What foods are not recommended? The items listed may not be a complete list. Talk with your dietitian about what dietary choices are best for you. Grains Pastries or quick breads with added fat. [place] toast. Vegetables Deep fried vegetables. [place] fries. Any vegetables prepared with added fat. Any vegetables that cause symptoms. For some people this may include tomatoes and tomato products, chili peppers, onions and garlic, and horseradish. Fruits Any fruits prepared with added fat. Any fruits that cause symptoms. For some people this may include citrus fruits, such as oranges, grapefruit, pineapple, and lemons. Meats and other protein foods High-fat meats, such as fatty beef or pork, hot dogs, ribs, ham, sausage, salami and bacon. Fried meat or protein, including fried fish and fried chicken. Nuts and nut butters. Dairy Whole milk and chocolate milk. Sour cream. Cream. Ice cream. Cream cheese. Milk shakes. Beverages Coffee and tea, with or without caffeine. Carbonated beverages. Sodas. Energy drinks. Fruit juice made with acidic fruits (such as orange or grapefruit). Tomato juice. Alcoholic drinks. Fats and oils Butter. Margarine. Shortening. Ghee. Sweets and desserts Chocolate and cocoa. Donuts. Seasoning and other foods Pepper. Peppermint and spearmint. Any condiments, herbs, or seasonings that cause symptoms. For some people, this may include curry, hot  sauce, or vinegar-based salad dressings. Summary  When you have gastroesophageal reflux disease (GERD), food and lifestyle choices are very important to help ease the discomfort of GERD.  Eat frequent, small meals instead of three large meals each day. Eat  your meals slowly, in a relaxed setting. Avoid bending over or lying down until 2-3 hours after eating.  Limit high-fat foods such as fatty meat or fried foods. This information is not intended to replace advice given to you by your health care provider. Make sure you discuss any questions you have with your health care provider. Document Revised: 08/08/2018 Document Reviewed: 04/18/2016 Elsevier Patient Education  2020 Elsevier Inc.   Nausea, Adult Nausea is the feeling that you have an upset stomach or that you are about to vomit. Nausea on its own is not usually a serious concern, but it may be an early sign of a more serious medical problem. As nausea gets worse, it can lead to vomiting. If vomiting develops, or if you are not able to drink enough fluids, you are at risk of becoming dehydrated. Dehydration can make you tired and thirsty, cause you to have a dry mouth, and decrease how often you urinate. Older adults and people with other diseases or a weak disease-fighting system (immune system) are at higher risk for dehydration. The main goals of treating your nausea are:  To relieve your nausea.  To limit repeated nausea episodes.  To prevent vomiting and dehydration. Follow these instructions at home: Watch your symptoms for any changes. Tell your health care provider about them. Follow these instructions as told by your health care provider. Eating and drinking      Take an oral rehydration solution (ORS). This is a drink that is sold at pharmacies and retail stores.  Drink clear fluids slowly and in small amounts as you are able. Clear fluids include water, ice chips, low-calorie sports drinks, and fruit juice that has water added (diluted fruit juice).  Eat bland, easy-to-digest foods in small amounts as you are able. These foods include bananas, applesauce, rice, lean meats, toast, and crackers.  Avoid drinking fluids that contain a lot of sugar or caffeine, such as energy  drinks, sports drinks, and soda.  Avoid alcohol.  Avoid spicy or fatty foods. General instructions  Take over-the-counter and prescription medicines only as told by your health care provider.  Rest at home while you recover.  Drink enough fluid to keep your urine pale yellow.  Breathe slowly and deeply when you feel nauseous.  Avoid smelling things that have strong odors.  Wash your hands often using soap and water. If soap and water are not available, use hand sanitizer.  Make sure that all people in your household wash their hands well and often.  Keep all follow-up visits as told by your health care provider. This is important. Contact a health care provider if:  Your nausea gets worse.  Your nausea does not go away after two days.  You vomit.  You cannot drink fluids without vomiting.  You have any of the following: ? New symptoms. ? A fever. ? A headache. ? Muscle cramps. ? A rash. ? Pain while urinating.  You feel light-headed or dizzy. Get help right away if:  You have pain in your chest, neck, arm, or jaw.  You feel extremely weak or you faint.  You have vomit that is bright red or looks like coffee grounds.  You have bloody or black stools or stools that look  like tar.  You have a severe headache, a stiff neck, or both.  You have severe pain, cramping, or bloating in your abdomen.  You have difficulty breathing or are breathing very quickly.  Your heart is beating very quickly.  Your skin feels cold and clammy.  You feel confused.  You have signs of dehydration, such as: ? Dark urine, very little urine, or no urine. ? Cracked lips. ? Dry mouth. ? Sunken eyes. ? Sleepiness. ? Weakness. These symptoms may represent a serious problem that is an emergency. Do not wait to see if the symptoms will go away. Get medical help right away. Call your local emergency services (911 in the [place] Do not drive yourself to the hospital. Summary  Nausea  is the feeling that you have an upset stomach or that you are about to vomit. Nausea on its own is not usually a serious concern, but it may be an early sign of a more serious medical problem.  If vomiting develops, or if you are not able to drink enough fluids, you are at risk of becoming dehydrated.  Follow recommendations for eating and drinking and take over-the-counter and prescription medicines only as told by your health care provider.  Contact a health care provider right away if your symptoms worsen or you have new symptoms.  Keep all follow-up visits as told by your health care provider. This is important. This information is not intended to replace advice given to you by your health care provider. Make sure you discuss any questions you have with your health care provider. Document Revised: 09/25/2017 Document Reviewed: 09/25/2017 Elsevier Patient Education  2020 [employer].

## 2020-01-13 ENCOUNTER — Encounter: Payer: Self-pay | Admitting: Family Medicine

## 2020-01-13 DIAGNOSIS — K219 Gastro-esophageal reflux disease without esophagitis: Secondary | ICD-10-CM

## 2020-01-13 DIAGNOSIS — L700 Acne vulgaris: Secondary | ICD-10-CM

## 2020-01-13 MED ORDER — ADAPALENE 0.1 % EX CREA: TOPICAL_CREAM | Freq: Every day | CUTANEOUS | 1 refills | Status: AC

## 2020-01-13 MED ORDER — OMEPRAZOLE 20 MG PO CPDR: 20.0000 mg | DELAYED_RELEASE_CAPSULE | Freq: Two times a day (BID) | ORAL | 0 refills | Status: AC

## 2020-02-08 ENCOUNTER — Other Ambulatory Visit: Payer: Self-pay | Admitting: Family Medicine

## 2020-02-08 DIAGNOSIS — K219 Gastro-esophageal reflux disease without esophagitis: Secondary | ICD-10-CM

## 2020-04-12 ENCOUNTER — Telehealth: Payer: Self-pay | Admitting: Family Medicine

## 2020-04-12 NOTE — Telephone Encounter (Signed)
I'm not aware of places that can give vaccine and IV saline infusion. An option would be to give vaccine in office, have her laying down (possibly in trendelenberg (feet up), and have her drink a liter of Gatorade while here.  Have zofran on hand if needed for vomiting.  Any other suggestions?  Will infusion center have vaccine to give?  I've never had anyone go for a liter of saline IV, but possibly an option?

## 2020-04-12 NOTE — Telephone Encounter (Signed)
Pt was notified and said she will figure something out.

## 2020-04-12 NOTE — Telephone Encounter (Signed)
Pt called and states that she is looking for a local infusion clinic. She is wanting to get a booster vaccine while she is home and knows its going to make her very sick. She would like to have her regular IV infusion prior. Please advise pt at [phone].

## 2020-04-12 NOTE — Telephone Encounter (Signed)
I don't know what IV infusion she is talking about. Please check with patient to get more information

## 2020-04-12 NOTE — Telephone Encounter (Signed)
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 She said where she goes to school has a center for that but she is now home and she is trying to avoid long wait times at urgent care. She said when got the flu shot and it made her really sick for 3 weeks. When she first got the [employer] vaccine her POTS was not bad at the time. Please advise.

## 2020-09-10 ENCOUNTER — Ambulatory Visit (INDEPENDENT_AMBULATORY_CARE_PROVIDER_SITE_OTHER): Payer: BC Managed Care – PPO | Admitting: Nurse Practitioner

## 2020-09-10 ENCOUNTER — Other Ambulatory Visit (HOSPITAL_COMMUNITY)
Admission: RE | Admit: 2020-09-10 | Discharge: 2020-09-10 | Disposition: A | Discharge status: Home / Self Care | Payer: BC Managed Care – PPO | Source: Ambulatory Visit | Attending: Nurse Practitioner | Admitting: Nurse Practitioner

## 2020-09-10 ENCOUNTER — Encounter: Payer: Self-pay | Admitting: Nurse Practitioner

## 2020-09-10 ENCOUNTER — Other Ambulatory Visit: Payer: Self-pay

## 2020-09-10 VITALS — BP 116/72 | Ht 63.0 in | Wt 123.0 lb

## 2020-09-10 DIAGNOSIS — Z113 Encounter for screening for infections with a predominantly sexual mode of transmission: Secondary | ICD-10-CM

## 2020-09-10 DIAGNOSIS — Z01419 Encounter for gynecological examination (general) (routine) without abnormal findings: Secondary | ICD-10-CM

## 2020-09-10 DIAGNOSIS — Z30431 Encounter for routine checking of intrauterine contraceptive device: Secondary | ICD-10-CM | POA: Diagnosis not present

## 2020-09-10 NOTE — Patient Instructions (Addendum)
[facility] -[facility] HIV, Hep C and Syphilis testing (look for testing sites around the [place]) It was nice to meet you. I hope you have a great [place] Ouch this week!  Health Maintenance, Female Adopting a healthy lifestyle and getting preventive care are important in promoting health and wellness. Ask your health care provider about:  The right schedule for you to have regular tests and exams.  Things you can do on your own to prevent diseases and keep yourself healthy. What should I know about diet, weight, and exercise? Eat a healthy diet  Eat a diet that includes plenty of vegetables, fruits, low-fat dairy products, and lean protein.  Do not eat a lot of foods that are high in solid fats, added sugars, or sodium.   Maintain a healthy weight Body mass index (BMI) is used to identify weight problems. It estimates body fat based on height and weight. Your health care provider can help determine your BMI and help you achieve or maintain a healthy weight. Get regular exercise Get regular exercise. This is one of the most important things you can do for your health. Most adults should:  Exercise for at least 150 minutes each week. The exercise should increase your heart rate and make you sweat (moderate-intensity exercise).  Do strengthening exercises at least twice a week. This is in addition to the moderate-intensity exercise.  Spend less time sitting. Even light physical activity can be beneficial. Watch cholesterol and blood lipids Have your blood tested for lipids and cholesterol at 21 years of age, then have this test every 5 years. Have your cholesterol levels checked more often if:  Your lipid or cholesterol levels are high.  You are older than 21 years of age.  You are at high risk for heart disease. What should I know about cancer screening? Depending on your health history and family history, you may need to have cancer screening at various ages. This may include screening  for:  Breast cancer.  Cervical cancer.  Colorectal cancer.  Skin cancer.  Lung cancer. What should I know about heart disease, diabetes, and high blood pressure? Blood pressure and heart disease  High blood pressure causes heart disease and increases the risk of stroke. This is more likely to develop in people who have high blood pressure readings, are of African descent, or are overweight.  Have your blood pressure checked: ? Every 3-5 years if you are 75-57 years of age. ? Every year if you are 24 years old or older. Diabetes Have regular diabetes screenings. This checks your fasting blood sugar level. Have the screening done:  Once every three years after age 19 if you are at a normal weight and have a low risk for diabetes.  More often and at a younger age if you are overweight or have a high risk for diabetes. What should I know about preventing infection? Hepatitis B If you have a higher risk for hepatitis B, you should be screened for this virus. Talk with your health care provider to find out if you are at risk for hepatitis B infection. Hepatitis C Testing is recommended for:  Everyone born from [birth year] through 1966.  Anyone with known risk factors for hepatitis C. Sexually transmitted infections (STIs)  Get screened for STIs, including gonorrhea and chlamydia, if: ? You are sexually active and are younger than 21 years of age. ? You are older than 21 years of age and your health care provider tells you that you are at  risk for this type of infection. ? Your sexual activity has changed since you were last screened, and you are at increased risk for chlamydia or gonorrhea. Ask your health care provider if you are at risk.  Ask your health care provider about whether you are at high risk for HIV. Your health care provider may recommend a prescription medicine to help prevent HIV infection. If you choose to take medicine to prevent HIV, you should first get tested for HIV.  You should then be tested every 3 months for as long as you are taking the medicine. Pregnancy  If you are about to stop having your period (premenopausal) and you may become pregnant, seek counseling before you get pregnant.  Take 400 to 800 micrograms (mcg) of folic acid every day if you become pregnant.  Ask for birth control (contraception) if you want to prevent pregnancy. Osteoporosis and menopause Osteoporosis is a disease in which the bones lose minerals and strength with aging. This can result in bone fractures. If you are 22 years old or older, or if you are at risk for osteoporosis and fractures, ask your health care provider if you should:  Be screened for bone loss.  Take a calcium or vitamin D supplement to lower your risk of fractures.  Be given hormone replacement therapy (HRT) to treat symptoms of menopause. Follow these instructions at home: Lifestyle  Do not use any products that contain nicotine or tobacco, such as cigarettes, e-cigarettes, and chewing tobacco. If you need help quitting, ask your health care provider.  Do not use street drugs.  Do not share needles.  Ask your health care provider for help if you need support or information about quitting drugs. Alcohol use  Do not drink alcohol if: ? Your health care provider tells you not to drink. ? You are pregnant, may be pregnant, or are planning to become pregnant.  If you drink alcohol: ? Limit how much you use to 0-1 drink a day. ? Limit intake if you are breastfeeding.  Be aware of how much alcohol is in your drink. In the [place] one drink equals one 12 oz bottle of beer (355 mL), one 5 oz glass of wine (148 mL), or one 1 oz glass of hard liquor (44 mL). General instructions  Schedule regular health, dental, and eye exams.  Stay current with your vaccines.  Tell your health care provider if: ? You often feel depressed. ? You have ever been abused or do not feel safe at  home. Summary  Adopting a healthy lifestyle and getting preventive care are important in promoting health and wellness.  Follow your health care provider's instructions about healthy diet, exercising, and getting tested or screened for diseases.  Follow your health care provider's instructions on monitoring your cholesterol and blood pressure. This information is not intended to replace advice given to you by your health care provider. Make sure you discuss any questions you have with your health care provider. Document Revised: 04/10/2018 Document Reviewed: 04/10/2018 Elsevier Patient Education  2021 [employer].

## 2020-09-10 NOTE — Progress Notes (Signed)
20 y.o. G0P0000 Single White or Caucasian female here for annual exam.   Has Mirena, has been in x 5 years  Mother CHEK 2, breast cancer gene MGM dx with Breast Cancer, maternal aunt diagnosed with Breast Cancer  Pt reported Lumpy breast tissue, had [place] in [place] and was told just dense tissue   No LMP recorded. (Menstrual status: IUD).   Reports only minimal bleeding, usually only uses panti-liner. Has only occasional spotting about once per month  Sexually active: Yes.   together x 1.5 years The current method of family planning is IUD.    Exercising: Yes.    Smoker:  yes  Health Maintenance: PZW:CHENI History of abnormal Pap:  no MMG: never Colonoscopy:never BMD:  never Gardasil: yes Covid-19:Johnson,Maderna Hep C testing:never Screening Labs:PCP   reports that she has been smoking e-cigarettes. She has never used smokeless tobacco. She reports current alcohol use. She reports current drug use. Drug: Marijuana.  Past Medical History:  Diagnosis Date  . Acne    (Dr. Nino Parsley practice)  . Anxiety   . Depression   . Dizziness 03/2014   orthostatic hypotension--[facility] eval Dr. Clent Ridges  . Dysmenorrhea   . Gastritis 10/28/2013   chronic gastritis noted on bx from EGD  . GERD (gastroesophageal reflux disease)    ([facility])  . Migraine headache 5th or 6th grade   sees Elveria Rising  . Overdose of medication   . POTS (postural orthostatic tachycardia syndrome) 2015  . Vulvodynia     Past Surgical History:  Procedure Laterality Date  . INTRAUTERINE DEVICE (IUD) INSERTION  10/2015   Mirena  . LAPAROSCOPIC CHOLECYSTECTOMY  04/21/14   Dr. Dell Ponto ([facility])    Current Outpatient Medications  Medication Sig Dispense Refill  . adapalene (DIFFERIN) 0.1 % cream Apply topically at bedtime. 45 g 1  . Calcium-Phosphorus-Vitamin D (CALCIUM GUMMIES PO) Take 1 each by mouth daily.    . cyproheptadine (PERIACTIN) 4 MG tablet Take 4 mg by mouth 2 (two) times daily.    . Ferrous  Sulfate (IRON) 325 (65 Fe) MG TABS Take 1 tablet by mouth daily.    Marland Kitchen FIBER ADULT GUMMIES PO Take 1 each by mouth daily.    . fludrocortisone (FLORINEF) 0.1 MG tablet Take by mouth.    . JORNAY PM 100 MG CP24 Take 1 capsule by mouth at bedtime.    Marland Kitchen levonorgestrel (MIRENA) 20 MCG/24HR IUD 1 each by Intrauterine route once.    . methylphenidate (METADATE CD) 40 MG CR capsule Take 40 mg by mouth every morning.    . Multiple Vitamins-Minerals (MULTIVITAMIN WITH MINERALS) tablet Take 1 tablet by mouth daily.    . naproxen sodium (ANAPROX DS) 550 MG tablet 1 tab po q 12 hours prn pain 30 tablet 2  . omeprazole (PRILOSEC) 20 MG capsule Take 1 capsule (20 mg total) by mouth 2 (two) times daily before a meal. 60 capsule 0  . ondansetron (ZOFRAN) 4 MG tablet Take 1 tablet (4 mg total) by mouth every 8 (eight) hours as needed for nausea or vomiting. 15 tablet 0  . ondansetron (ZOFRAN-ODT) 4 MG disintegrating tablet Take 4 mg by mouth every 6 (six) hours as needed.    . oral electrolytes (THERMOTABS) TABS tablet Take by mouth.    . Probiotic Product (PROBIOTIC DAILY PO) Take 1 capsule by mouth daily.    . propranolol (INDERAL) 10 MG tablet Take 10 mg by mouth 2 (two) times daily.    . SUMAtriptan (IMITREX)  50 MG tablet Take 50 mg by mouth 2 (two) times daily as needed.    . Amphetamine Sulfate 10 MG TABS Take 1 tablet by mouth as needed. (Patient not taking: No sig reported)    . Ascorbic Acid (VITAMIN C) 100 MG CHEW Chew 1 each by mouth daily. (Patient not taking: Reported on 09/10/2020)     No current facility-administered medications for this visit.    Family History  Problem Relation Age of Onset  . Breast cancer Maternal Aunt 49  . Migraines Mother   . Breast cancer Maternal Grandmother   . Migraines Paternal Grandmother   . Diabetes Paternal Grandfather   . Migraines Paternal Grandfather     Review of Systems  All other systems reviewed and are negative.   Exam:   Ht 5\' 3"  (1.6 m)   Wt  123 lb (55.8 kg)   BMI 21.79 kg/m   Height: 5\' 3"  (160 cm)  General appearance: alert, cooperative and appears stated age, no acute distress Head: Normocephalic, without obvious abnormality Neck: no adenopathy, thyroid normal to inspection and palpation Lungs: clear to auscultation bilaterally Breasts: No axillary or supraclavicular adenopathy, Normal to palpation without dominant masses, glandular Heart: regular rate and rhythm Abdomen: soft, non-tender; no masses,  no organomegaly Extremities: extremities normal, no edema Skin: No rashes or lesions Lymph nodes: Cervical, supraclavicular, and axillary nodes normal. No abnormal inguinal nodes palpated Neurologic: Grossly normal   Pelvic: External genitalia:  no lesions              Urethra:  normal appearing urethra with no masses, tenderness or lesions              Bartholins and Skenes: normal                 Vagina: normal appearing vagina, appropriate for age, normal appearing discharge, no lesions              Cervix: neg cervical motion tenderness, no visible lesions, strings ~1cm             Bimanual Exam:   Uterus:  normal size, contour, position, consistency, mobility, non-tender              Adnexa: no mass, fullness, tenderness                 Kim, CMA Chaperone was present for exam.  A/P:  Well woman exam - Plan: Cytology - PAP( )  Screen for sexually transmitted diseases -GC/CT collected with pap  Surveillance of intrauterine contraceptive device- discussed now FDA approved X 7 years (good for 2 more years). If problems or bleeding, can take out earlier

## 2020-09-15 ENCOUNTER — Encounter: Payer: Self-pay | Admitting: Nurse Practitioner

## 2020-09-15 LAB — CYTOLOGY - PAP
Chlamydia: NEGATIVE
Comment: NEGATIVE
Comment: NORMAL
Diagnosis: NEGATIVE
Neisseria Gonorrhea: NEGATIVE

## 2020-10-29 ENCOUNTER — Telehealth: Payer: Self-pay

## 2020-10-29 NOTE — Telephone Encounter (Signed)
Call and requesting a handicap placard . Pt advised she normally has her doctor in [place] fill it out but she needs a [place] one. Pt advised she has POTS and this is why she needs it. Please advise. KH

## 2020-10-31 NOTE — Telephone Encounter (Signed)
Pt has h/o POTS listed in her chart, as being diagnosed at age 20.  Since she has been under my care, to my knowledge this has not caused any significant symptoms or limitations to warrant handicap placard.  (We have discussed her migraines, ADD, reflux issues, never significant issues with POTS other than being listed in her chart). She hasn't had a physical since 2018, only seen for acute visits, not seen since 11/2019 (virtual), 06/2019 (in office), with no visits scheduled.   Therefore, I cannot give her what she is asking for. Happy to see her for a med check to address any concerns she has, and ideally for a physical if she will be remaining under my care (not sure if she will be staying in [place] long, vs staying in [place])

## 2020-11-02 NOTE — Telephone Encounter (Signed)
Lvm advising pt of Dr. Lynelle Doctor message. KH

## 2021-03-19 IMAGING — CR THORACIC SPINE - 3 VIEWS
3 series · 3 of 3 positions shown · non-contrast
Comparison: None.

CLINICAL DATA: Chronic midline thoracic back pain.

EXAM:
THORACIC SPINE - 3 VIEWS

[t t-spine a.p.]
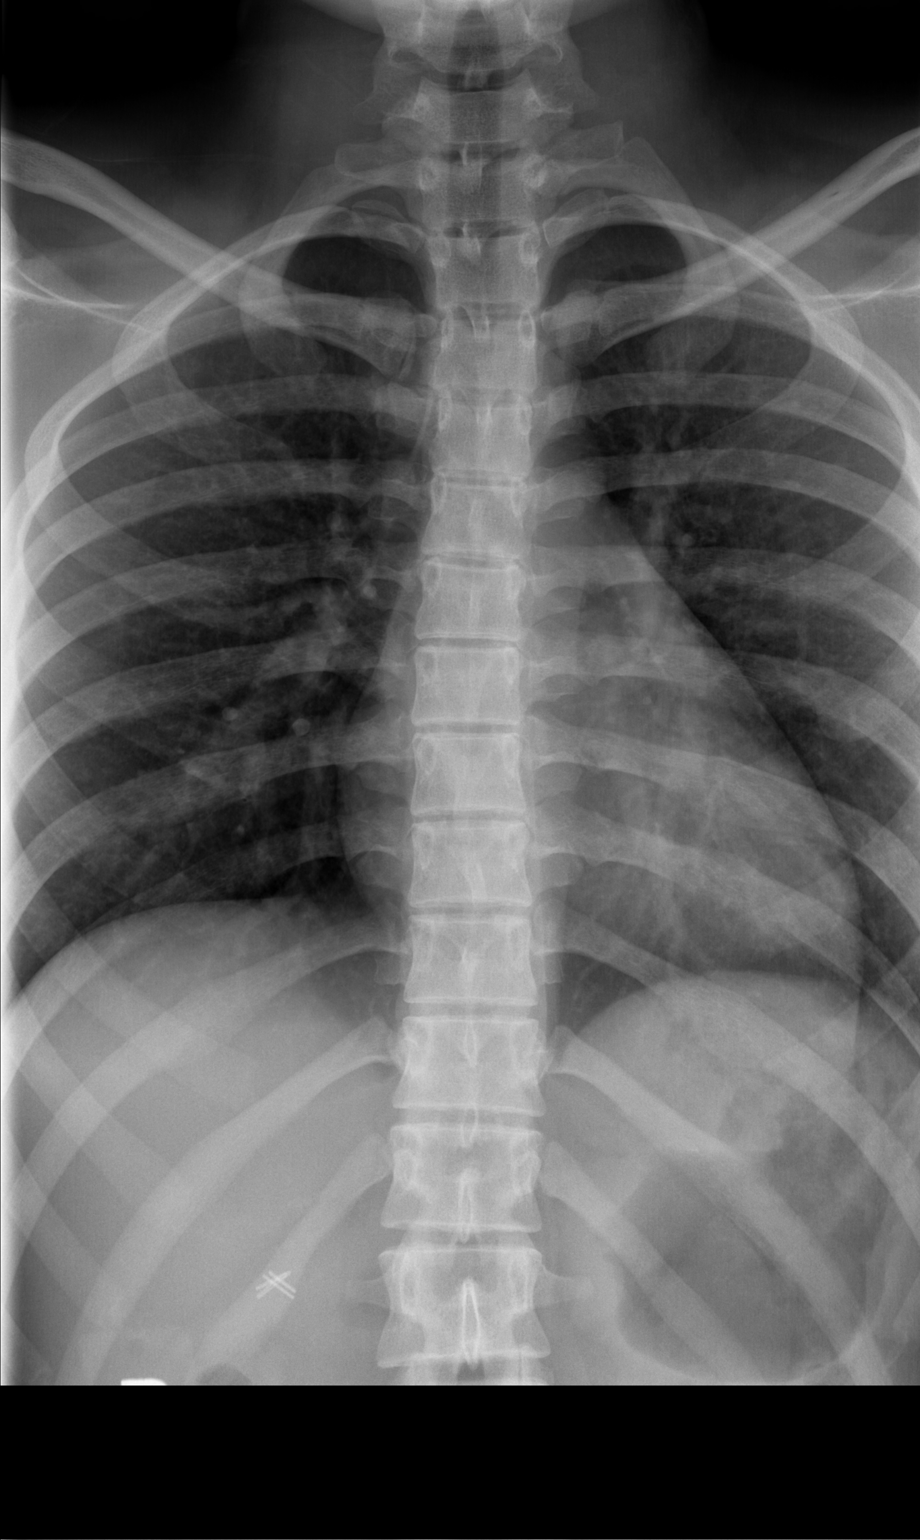

[t t-spine lat *]
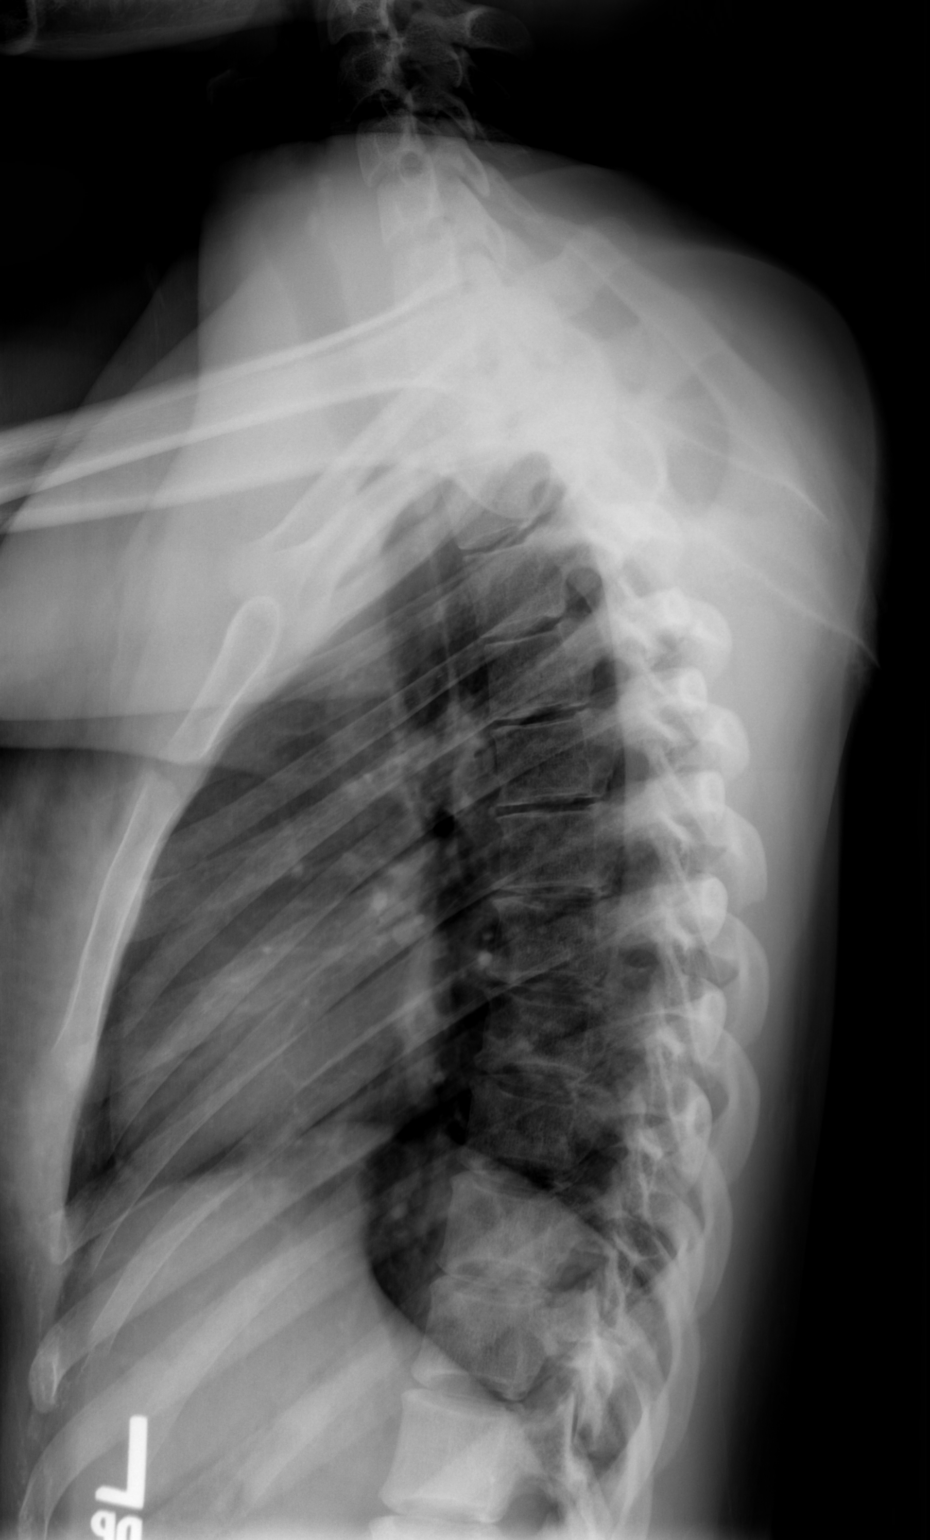

[t swimmers]
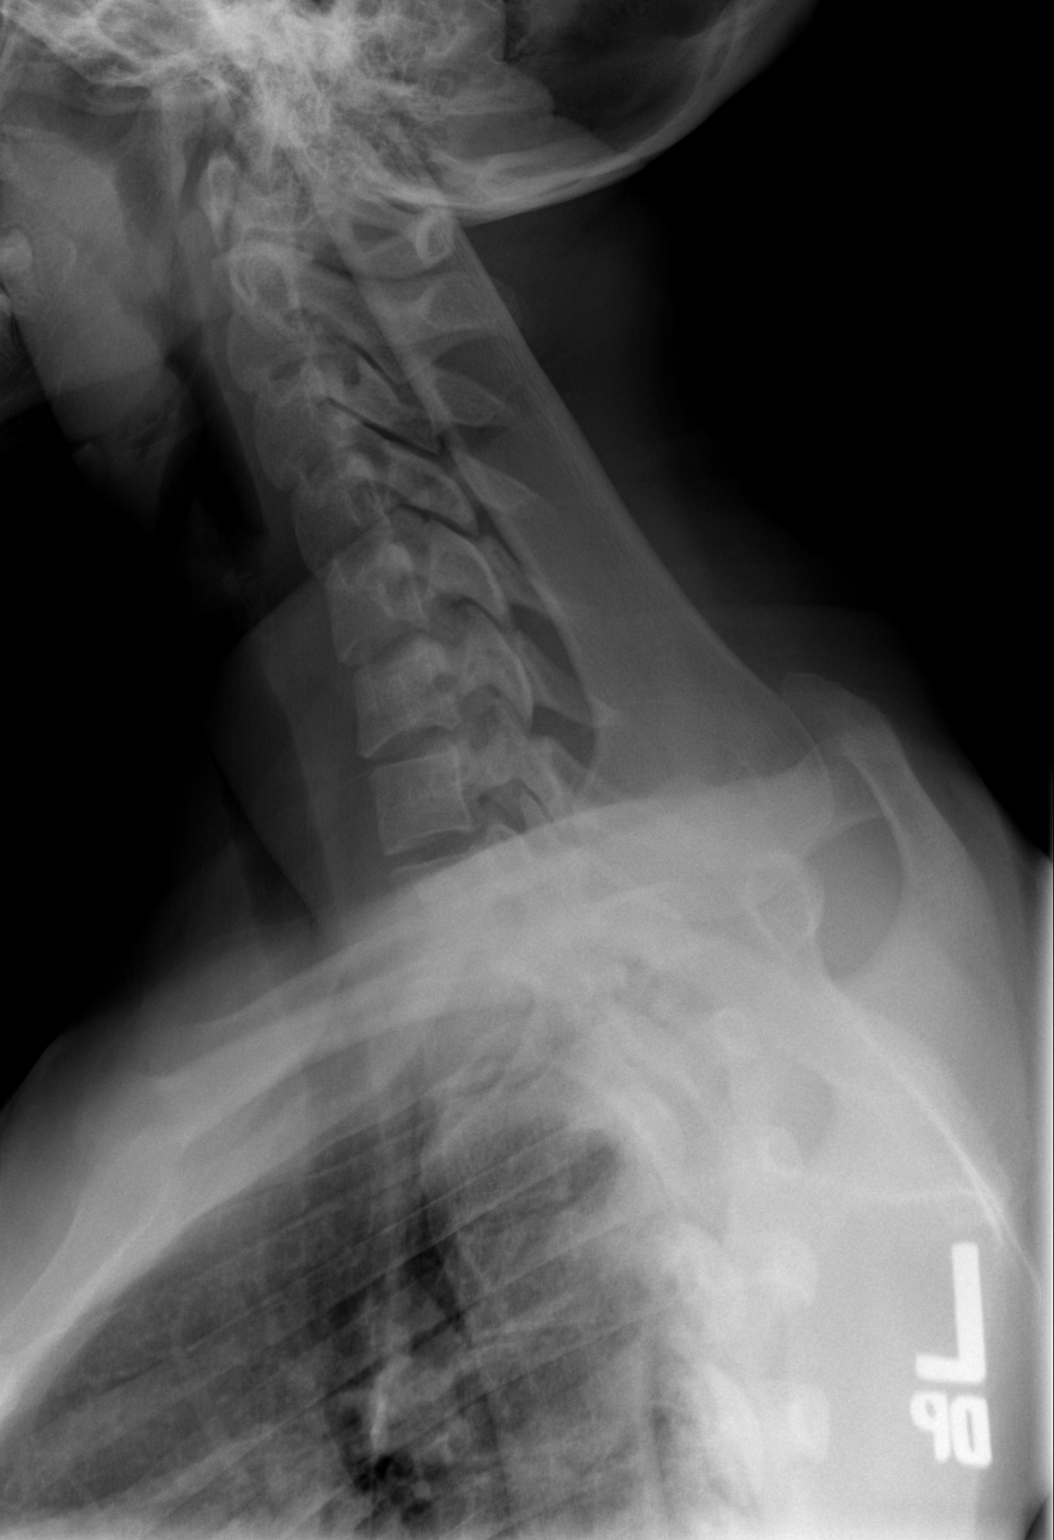

[3 of 3 positions shown; findings below may reference images not displayed]

FINDINGS: There is no evidence of thoracic spine fracture. Alignment is
normal. No other significant bone abnormalities are identified.
IMPRESSION: Negative.

## 2022-01-04 ENCOUNTER — Encounter: Payer: Self-pay | Admitting: Internal Medicine

## 2022-02-07 ENCOUNTER — Encounter: Payer: Self-pay | Admitting: Internal Medicine

## 2022-06-05 DIAGNOSIS — S39012A Strain of muscle, fascia and tendon of lower back, initial encounter: Secondary | ICD-10-CM | POA: Diagnosis not present

## 2022-06-08 ENCOUNTER — Encounter: Payer: Self-pay | Admitting: *Deleted

## 2022-07-03 DIAGNOSIS — Z79899 Other long term (current) drug therapy: Secondary | ICD-10-CM | POA: Diagnosis not present

## 2022-07-03 DIAGNOSIS — F902 Attention-deficit hyperactivity disorder, combined type: Secondary | ICD-10-CM | POA: Diagnosis not present

## 2022-07-03 DIAGNOSIS — Z79891 Long term (current) use of opiate analgesic: Secondary | ICD-10-CM | POA: Diagnosis not present

## 2022-07-03 DIAGNOSIS — F419 Anxiety disorder, unspecified: Secondary | ICD-10-CM | POA: Diagnosis not present

## 2022-07-03 DIAGNOSIS — Z5181 Encounter for therapeutic drug level monitoring: Secondary | ICD-10-CM | POA: Diagnosis not present

## 2022-10-19 ENCOUNTER — Encounter: Payer: Self-pay | Admitting: Podiatry

## 2022-10-23 ENCOUNTER — Encounter: Payer: Self-pay | Admitting: Podiatry

## 2022-10-23 ENCOUNTER — Ambulatory Visit (INDEPENDENT_AMBULATORY_CARE_PROVIDER_SITE_OTHER): Payer: BC Managed Care – PPO | Admitting: Podiatry

## 2022-10-23 DIAGNOSIS — L600 Ingrowing nail: Secondary | ICD-10-CM | POA: Diagnosis not present

## 2022-10-24 DIAGNOSIS — Z79899 Other long term (current) drug therapy: Secondary | ICD-10-CM | POA: Diagnosis not present

## 2022-10-24 DIAGNOSIS — F902 Attention-deficit hyperactivity disorder, combined type: Secondary | ICD-10-CM | POA: Diagnosis not present

## 2022-10-25 DIAGNOSIS — F902 Attention-deficit hyperactivity disorder, combined type: Secondary | ICD-10-CM | POA: Diagnosis not present

## 2022-10-25 NOTE — Progress Notes (Signed)
Subjective:   Patient ID: Shelley Foster, female   DOB: 22 y.o.   MRN: [id number]   HPI Patient presents with chronic ingrown's of both feet and states that it is mostly the medial borders but the lateral at x 2.  States that it has been a chronic condition been going on for a long time she would like to get it corrected and is going to [place] on Friday to see Shelley Foster.  Patient does smoke e-cigarettes tries to be active   Review of Systems  All other systems reviewed and are negative.       Objective:  Physical Exam Vitals and nursing note reviewed.  Constitutional:      Appearance: She is well-developed.  Pulmonary:     Effort: Pulmonary effort is normal.  Musculoskeletal:        General: Normal range of motion.  Skin:    General: Skin is warm.  Neurological:     Mental Status: She is alert.     Neurovascular status intact muscle strength adequate range of motion within normal limits with incurvated hallux nails bilateral medial borders worse lateral borders moderate.  Mild redness no drainage noted with structural changes of the nailbeds.  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  Due to the fact she is going out of town in a few days we will get a hold off and do this when she returns from her trip with education given today concerning the condition and treatment

## 2022-11-10 ENCOUNTER — Encounter: Payer: Self-pay | Admitting: Podiatry

## 2022-11-10 ENCOUNTER — Ambulatory Visit (INDEPENDENT_AMBULATORY_CARE_PROVIDER_SITE_OTHER): Payer: BC Managed Care – PPO | Admitting: Podiatry

## 2022-11-10 DIAGNOSIS — L600 Ingrowing nail: Secondary | ICD-10-CM

## 2022-11-10 NOTE — Patient Instructions (Signed)

## 2022-11-10 NOTE — Progress Notes (Signed)
Subjective:   Patient ID: Shelley Foster, female   DOB: 22 y.o.   MRN: [id number]   HPI Patient presents chronic ingrown toenail of the big toes bilateral painful when pressed   ROS      Objective:  Physical Exam  Chronic ingrown toenail deformity hallux bilateral medial lateral borders that she wants to get fixed     Assessment:  There was pain and deformity noted upon evaluation     Plan:  H&P reviewed allowed her to read then signed consent form and I infiltrated each big toe today 60 mg like Marcaine mixture sterile prep done using sterile instrumentation remove the medial lateral borders exposed matrix applied phenol 3 applications 30 seconds followed by alcohol lavage to each border applied sterile dressing instructed on soaks and wearing dressing for 24 hours take it off earlier if throbbing were to occur

## 2023-02-05 DIAGNOSIS — F902 Attention-deficit hyperactivity disorder, combined type: Secondary | ICD-10-CM | POA: Diagnosis not present

## 2023-02-05 DIAGNOSIS — Z79899 Other long term (current) drug therapy: Secondary | ICD-10-CM | POA: Diagnosis not present

## 2023-03-02 DIAGNOSIS — K649 Unspecified hemorrhoids: Secondary | ICD-10-CM | POA: Diagnosis not present

## 2023-03-02 DIAGNOSIS — K5900 Constipation, unspecified: Secondary | ICD-10-CM | POA: Diagnosis not present

## 2023-03-20 ENCOUNTER — Ambulatory Visit (INDEPENDENT_AMBULATORY_CARE_PROVIDER_SITE_OTHER): Payer: BC Managed Care – PPO | Admitting: Obstetrics and Gynecology

## 2023-03-20 ENCOUNTER — Telehealth: Payer: Self-pay | Admitting: Genetic Counselor

## 2023-03-20 ENCOUNTER — Encounter: Payer: Self-pay | Admitting: Obstetrics and Gynecology

## 2023-03-20 VITALS — BP 118/74 | HR 95 | Ht 65.0 in | Wt 149.0 lb

## 2023-03-20 DIAGNOSIS — Z720 Tobacco use: Secondary | ICD-10-CM | POA: Insufficient documentation

## 2023-03-20 DIAGNOSIS — Z01419 Encounter for gynecological examination (general) (routine) without abnormal findings: Secondary | ICD-10-CM | POA: Diagnosis not present

## 2023-03-20 DIAGNOSIS — Z803 Family history of malignant neoplasm of breast: Secondary | ICD-10-CM | POA: Diagnosis not present

## 2023-03-20 DIAGNOSIS — L700 Acne vulgaris: Secondary | ICD-10-CM

## 2023-03-20 DIAGNOSIS — Z975 Presence of (intrauterine) contraceptive device: Secondary | ICD-10-CM

## 2023-03-20 DIAGNOSIS — Z3009 Encounter for other general counseling and advice on contraception: Secondary | ICD-10-CM

## 2023-03-20 DIAGNOSIS — Z113 Encounter for screening for infections with a predominantly sexual mode of transmission: Secondary | ICD-10-CM

## 2023-03-20 MED ORDER — SPIRONOLACTONE 25 MG PO TABS
25.0000 mg | ORAL_TABLET | Freq: Every day | ORAL | 3 refills | Status: DC
Start: 2023-03-20 — End: 2023-05-30

## 2023-03-20 NOTE — Assessment & Plan Note (Signed)
In last year of Mirena use, wants to replace IUD RTO for replacement

## 2023-03-20 NOTE — Assessment & Plan Note (Signed)
Encouraged cessation, reviewed risks.

## 2023-03-20 NOTE — Telephone Encounter (Signed)
Called patient no answer ; left message to call office to schedule genetics appt.

## 2023-03-20 NOTE — Progress Notes (Signed)
22 y.o. G0P0000 female with hx of dysmenorrhea, dyspareunia, vulvodynia, family history of breast cancer (Mother +CHEK 2), with Mirena IUD (inserted July 2017), POTS here for annual exam. Boyfriend x4 years. 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  Fam Hx of breast cancer: Mother CHEK 2, breast cancer gene. MGM dx with Breast Cancer, maternal aunt diagnosed with Breast Cancer.  No longer experiences vulvodynia, dyspareunia or dysmenorrhea. Wants to replace IUD, considering Kyleena vs Mirena  No LMP recorded (lmp unknown). (Menstrual status: IUD).   Abnormal bleeding: no Pelvic discharge or pain: no Breast mass, nipple discharge or skin changes : no Birth control: IUD Last PAP:     Component Value Date/Time   DIAGPAP  09/10/2020 1456    - Negative for intraepithelial lesion or malignancy (NILM)   ADEQPAP  09/10/2020 1456    Satisfactory for evaluation; transformation zone component PRESENT.   Gardasil: yes Sexually active: yes, boyfriend of 4 years  Exercising: rarely, used to dance ballet Smoker: yes, vapes  GYN HISTORY: No significant history  OB History  Gravida Para Term Preterm AB Living  0 0 0 0 0 0  SAB IAB Ectopic Multiple Live Births  0 0 0 0      Past Medical History:  Diagnosis Date   Acne    (Dr. Nino Parsley practice)   Anxiety    Depression    Dizziness 03/2014   orthostatic hypotension--[facility] eval Dr. Clent Ridges   Dysmenorrhea    Gastritis 10/28/2013   chronic gastritis noted on bx from EGD   GERD (gastroesophageal reflux disease)    ([facility])   Migraine headache 5th or 6th grade   sees Elveria Rising   Overdose of medication    POTS (postural orthostatic tachycardia syndrome) 2015   Vulvodynia     Past Surgical History:  Procedure Laterality Date   INTRAUTERINE DEVICE (IUD) INSERTION  10/2015   Mirena   LAPAROSCOPIC CHOLECYSTECTOMY  04/21/14   Dr. Dell Ponto ([facility])    Current Outpatient Medications on File Prior to Visit   Medication Sig Dispense Refill   adapalene (DIFFERIN) 0.1 % cream Apply topically at bedtime. 45 g 1   fludrocortisone (FLORINEF) 0.1 MG tablet Take 0.05 mg by mouth daily.     JORNAY PM 100 MG CP24 Take 1 capsule by mouth at bedtime.     levonorgestrel (MIRENA) 20 MCG/24HR IUD 1 each by Intrauterine route once.     methylphenidate (METADATE CD) 40 MG CR capsule Take 40 mg by mouth every morning.     Multiple Vitamins-Minerals (MULTIVITAMIN WITH MINERALS) tablet Take 1 tablet by mouth daily.     naproxen sodium (ANAPROX DS) 550 MG tablet 1 tab po q 12 hours prn pain 30 tablet 2   omeprazole (PRILOSEC) 20 MG capsule Take 1 capsule (20 mg total) by mouth 2 (two) times daily before a meal. 60 capsule 0   ondansetron (ZOFRAN-ODT) 4 MG disintegrating tablet Take 4 mg by mouth every 6 (six) hours as needed.     oral electrolytes (THERMOTABS) TABS tablet Take by mouth.     Probiotic Product (PROBIOTIC DAILY PO) Take 1 capsule by mouth daily.     propranolol (INDERAL) 10 MG tablet Take 10 mg by mouth 2 (two) times daily.     rizatriptan (MAXALT) 5 MG tablet Take 5 mg by mouth daily as needed.     Menaquinone-7 40 MCG TABS Take by mouth.     No current facility-administered medications on file prior to  visit.    Social History   Socioeconomic History   Marital status: Single    Spouse name: Not on file   Number of children: Not on file   Years of education: Not on file   Highest education level: Not on file  Occupational History   Not on file  Tobacco Use   Smoking status: Every Day    Types: E-cigarettes   Smokeless tobacco: Never  Vaping Use   Vaping status: Every Day  Substance and Sexual Activity   Alcohol use: Yes    Comment: Rare   Drug use: Yes    Types: Marijuana   Sexual activity: Yes    Partners: Male    Birth control/protection: I.U.D.  Other Topics Concern   Not on file  Social History Narrative   Zoi graduated from [employer].   She now  attends [address] of [place]   Lives with mom, dad, younger sister and two dogs.   Social Determinants of Health   Financial Resource Strain: Not on file  Food Insecurity: Not on file  Transportation Needs: Not on file  Physical Activity: Not on file  Stress: Not on file  Social Connections: Not on file  Intimate Partner Violence: Not on file    Family History  Problem Relation Age of Onset   Breast cancer Maternal Aunt 60   Migraines Mother    Breast cancer Maternal Grandmother    Alzheimer's disease Maternal Grandfather    Migraines Paternal Grandmother    Diabetes Paternal Grandfather    Migraines Paternal Grandfather     Allergies  Allergen Reactions   Tilactase Nausea Only      PE Today's Vitals   03/20/23 1129  BP: 118/74  Pulse: 95  SpO2: 98%  Weight: 149 lb (67.6 kg)  Height: 5\' 5"  (1.651 m)   Body mass index is 24.79 kg/m.  Physical Exam Vitals reviewed. Exam conducted with a chaperone present.  Constitutional:      General: She is not in acute distress.    Appearance: Normal appearance.  HENT:     Head: Normocephalic and atraumatic.     Nose: Nose normal.  Eyes:     Extraocular Movements: Extraocular movements intact.     Conjunctiva/sclera: Conjunctivae normal.  Neck:     Thyroid: No thyroid mass, thyromegaly or thyroid tenderness.  Pulmonary:     Effort: Pulmonary effort is normal.  Chest:     Chest wall: No mass or tenderness.  Breasts:    Right: Normal. No swelling, mass, nipple discharge, skin change or tenderness.     Left: Normal. No swelling, mass, nipple discharge, skin change or tenderness.  Abdominal:     General: There is no distension.     Palpations: Abdomen is soft.     Tenderness: There is no abdominal tenderness.  Genitourinary:    General: Normal vulva.     Exam position: Lithotomy position.     Urethra: No prolapse.     Vagina: Normal. No vaginal discharge or bleeding.     Cervix: Normal. No lesion.     Uterus:  Normal. Not enlarged and not tender.      Adnexa: Right adnexa normal and left adnexa normal.     Comments: Short IUD strings Musculoskeletal:        General: Normal range of motion.     Cervical back: Normal range of motion.  Lymphadenopathy:     Upper Body:     Right upper body:  No axillary adenopathy.     Left upper body: No axillary adenopathy.     Lower Body: No right inguinal adenopathy. No left inguinal adenopathy.  Skin:    General: Skin is warm and dry.  Neurological:     General: No focal deficit present.     Mental Status: She is alert.  Psychiatric:        Mood and Affect: Mood normal.        Behavior: Behavior normal.       Assessment and Plan:        Well woman exam with routine gynecological exam Assessment & Plan: Cervical cancer screening performed according to ASCCP guidelines. Labs and immunizations with her primary Encouraged safe sexual practices as indicated Encouraged healthy lifestyle practices with diet and exercise For patients under 50yo, I recommend 1000mg  calcium daily and 600IU of vitamin D daily.    Screen for STD (sexually transmitted disease) -     SURESWAB CT/NG/T. vaginalis  Encounter for counseling regarding contraception Assessment & Plan: In last year of Mirena use, wants to replace IUD RTO for replacement   Uses hormone releasing intrauterine device (IUD) for contraception -     IUD Insertion; Future  Family history of breast cancer -     Ambulatory referral to Genetics  Acne vulgaris Assessment & Plan: Previously improved with COC However now using IUD Wants to trial spironolactone, reviewed diuretic that may worsen POTS symptoms. Also reviewed importance of reliable contraception All questions answered Check BMP at IUD insertion appt in 2 weeks F/u in 3 months  Orders: -     Spironolactone; Take 1 tablet (25 mg total) by mouth at bedtime.  Dispense: 90 tablet; Refill: 3  Tobacco use Assessment & Plan: Encouraged  cessation, reviewed risks.     Rosalyn Gess, MD

## 2023-03-20 NOTE — Patient Instructions (Signed)

## 2023-03-20 NOTE — Assessment & Plan Note (Signed)
Previously improved with COC However now using IUD Wants to trial spironolactone, reviewed diuretic that may worsen POTS symptoms. Also reviewed importance of reliable contraception All questions answered Check BMP at IUD insertion appt in 2 weeks F/u in 3 months

## 2023-03-20 NOTE — Assessment & Plan Note (Signed)
 Cervical cancer screening performed according to ASCCP guidelines. Labs and immunizations with her primary Encouraged safe sexual practices as indicated Encouraged healthy lifestyle practices with diet and exercise For patients under 23yo, I recommend 1000mg  calcium daily and 600IU of vitamin D daily.

## 2023-03-22 ENCOUNTER — Encounter: Payer: Self-pay | Admitting: Obstetrics and Gynecology

## 2023-03-22 LAB — SURESWAB CT/NG/T. VAGINALIS
C. trachomatis RNA, TMA: NOT DETECTED
N. gonorrhoeae RNA, TMA: NOT DETECTED
Trichomonas vaginalis RNA: NOT DETECTED

## 2023-03-23 ENCOUNTER — Other Ambulatory Visit: Payer: Self-pay | Admitting: Obstetrics and Gynecology

## 2023-03-23 DIAGNOSIS — K649 Unspecified hemorrhoids: Secondary | ICD-10-CM | POA: Insufficient documentation

## 2023-03-23 MED ORDER — HYDROCORTISONE ACETATE 25 MG RE SUPP
25.0000 mg | Freq: Two times a day (BID) | RECTAL | 0 refills | Status: AC
Start: 2023-03-23 — End: 2023-03-30

## 2023-03-23 NOTE — Progress Notes (Signed)
Rx sent per [employer] conversation.

## 2023-04-09 ENCOUNTER — Other Ambulatory Visit: Payer: Self-pay

## 2023-04-10 ENCOUNTER — Encounter: Payer: Self-pay | Admitting: Physician Assistant

## 2023-04-10 ENCOUNTER — Ambulatory Visit (INDEPENDENT_AMBULATORY_CARE_PROVIDER_SITE_OTHER): Payer: BC Managed Care – PPO | Admitting: Physician Assistant

## 2023-04-10 VITALS — BP 106/71 | HR 81 | Temp 98.2°F | Ht 63.0 in | Wt 147.2 lb

## 2023-04-10 DIAGNOSIS — K644 Residual hemorrhoidal skin tags: Secondary | ICD-10-CM

## 2023-04-10 DIAGNOSIS — K648 Other hemorrhoids: Secondary | ICD-10-CM

## 2023-04-10 DIAGNOSIS — K625 Hemorrhage of anus and rectum: Secondary | ICD-10-CM

## 2023-04-10 DIAGNOSIS — K602 Anal fissure, unspecified: Secondary | ICD-10-CM

## 2023-04-10 DIAGNOSIS — K649 Unspecified hemorrhoids: Secondary | ICD-10-CM

## 2023-04-10 DIAGNOSIS — K5900 Constipation, unspecified: Secondary | ICD-10-CM | POA: Diagnosis not present

## 2023-04-10 MED ORDER — HYDROCORTISONE ACETATE 25 MG RE SUPP
25.0000 mg | Freq: Every day | RECTAL | 1 refills | Status: AC
Start: 2023-04-10 — End: 2023-05-04

## 2023-04-10 NOTE — Patient Instructions (Signed)
For External Hemorrhoids: Warm water sitz bath with epsom salt for flare up of external hemorrhoids. Use OTC Preparation H, Tucks Pads, and Witch Hazel wipes as needed. Rx Hydrocortisone Cream 2.5% Apply 2-3 times daily as needed.  For Internal Hemorrhoids: Rx Hydrocortisone Suppositories 25mg  Insert 1 into rectum once daily at bedtime for 10-14 days. Stressed importance of treating underlying constipation. Avoid Sitting on the toilet for prolonged amount of time. Discussed Internal Hemorrhoid Banding if no improvement with conservative treament.   For Constipation: OTC Miralax Powder, Mix 1 capful in a drink once daily. OTC Colace Stool softener 100mg  1 capsule daily.

## 2023-04-10 NOTE — Progress Notes (Signed)
Celso [name], PA-C [address]  [address]  [place], [place] [address]  Main: [phone]  Fax: [phone]   Gastroenterology Consultation  Referring Provider:     Joselyn Arrow, MD Primary Care Physician:  Joselyn Arrow, MD Primary Gastroenterologist:  Celso [name], PA-C  Reason for Consultation:     Hemorrhoids        HPI:   Shelley Foster is a 22 y.o. y/o female referred for consultation & management  by Joselyn Arrow, MD.    Patient states she has had GI symptoms all of her life.  Diagnosed with IBS and GERD as a child.  She saw pediatric GI at [facility] in [place].  Had EGD when she was in seventh grade.  Cholecystectomy in 2015.  She is currently taking Prilosec 20 mg twice daily with good control of acid reflux.  Rarely takes Pepcid or antacid as needed for breakthrough GERD symptoms.  GERD is under fairly good control.  Past history of anorexia and POTS syndrome.  She developed a external hemorrhoid October 2024.  She was having some constipation.  She went to urgent care who prescribed hydrocortisone suppository with benefit.  Symptoms improved and then she had another recurrent hemorrhoid.  She has seen occasional episode of bright red blood on the tissue after having a bowel movement with hard stool or straining.  She is concerned about recurrent hemorrhoids and has questions about treatment.  She recently started OTC stool softener and fiber supplement which has helped.  Took MiraLAX in the past which caused abdominal cramping and bloating.  No previous colonoscopy.  IUD inserted 03/20/2023.  Past Medical History:  Diagnosis Date   Acne    (Dr. Nino Parsley practice)   Anxiety    Depression    Dizziness 03/2014   orthostatic hypotension--[facility] eval Dr. Clent Ridges   Dysmenorrhea    Gastritis 10/28/2013   chronic gastritis noted on bx from EGD   GERD (gastroesophageal reflux disease)    ([facility])   Migraine headache 5th or 6th grade   sees Elveria Rising    Overdose of medication    POTS (postural orthostatic tachycardia syndrome) 2015   Vulvodynia     Past Surgical History:  Procedure Laterality Date   INTRAUTERINE DEVICE (IUD) INSERTION  10/2015   Mirena   LAPAROSCOPIC CHOLECYSTECTOMY  04/21/14   Dr. Dell Ponto ([facility])    Prior to Admission medications   Medication Sig Start Date End Date Taking? Authorizing Provider  adapalene (DIFFERIN) 0.1 % cream Apply topically at bedtime. 01/13/20   Joselyn Arrow, MD  fludrocortisone (FLORINEF) 0.1 MG tablet Take 0.05 mg by mouth daily. 07/13/20   [provider]  JORNAY PM 100 MG CP24 Take 1 capsule by mouth at bedtime. 07/14/19   [provider]  levonorgestrel (MIRENA) 20 MCG/24HR IUD 1 each by Intrauterine route once.    [provider]  Menaquinone-7 40 MCG TABS Take by mouth. 08/08/13   [provider]  methylphenidate (METADATE CD) 40 MG CR capsule Take 40 mg by mouth every morning.    [provider]  Multiple Vitamins-Minerals (MULTIVITAMIN WITH MINERALS) tablet Take 1 tablet by mouth daily.    [provider]  naproxen sodium (ANAPROX DS) 550 MG tablet 1 tab po q 12 hours prn pain 08/09/15   Romualdo Bolk, MD  omeprazole (PRILOSEC) 20 MG capsule Take 1 capsule (20 mg total) by mouth 2 (two) times daily before a meal. 01/13/20   Joselyn Arrow,  MD  ondansetron (ZOFRAN-ODT) 4 MG disintegrating tablet Take 4 mg by mouth every 6 (six) hours as needed. 07/04/19   [provider]  oral electrolytes (THERMOTABS) TABS tablet Take by mouth.    [provider]  Probiotic Product (PROBIOTIC DAILY PO) Take 1 capsule by mouth daily.    [provider]  propranolol (INDERAL) 10 MG tablet Take 10 mg by mouth 2 (two) times daily. 08/31/20   [provider]  rizatriptan (MAXALT) 5 MG tablet Take 5 mg by mouth daily as needed. 12/12/22   [provider]  spironolactone (ALDACTONE) 25 MG tablet Take 1 tablet (25 mg total) by  mouth at bedtime. 03/20/23   Rosalyn Gess, MD    Family History  Problem Relation Age of Onset   Breast cancer Maternal Aunt 74   Migraines Mother    Breast cancer Maternal Grandmother    Alzheimer's disease Maternal Grandfather    Migraines Paternal Grandmother    Diabetes Paternal Grandfather    Migraines Paternal Grandfather      Social History   Tobacco Use   Smoking status: Every Day    Types: E-cigarettes   Smokeless tobacco: Never  Vaping Use   Vaping status: Every Day  Substance Use Topics   Alcohol use: Yes    Comment: Rare   Drug use: Yes    Types: Marijuana    Allergies as of 04/10/2023 - Review Complete 04/10/2023  Allergen Reaction Noted   Tilactase Nausea Only 08/09/2015    Review of Systems:    All systems reviewed and negative except where noted in HPI.   Physical Exam:  BP 106/71   Pulse 81   Temp 98.2 F (36.8 C)   Ht 5\' 3"  (1.6 m)   Wt 147 lb 3.2 oz (66.8 kg)   LMP  (LMP Unknown) Comment: mirena  BMI 26.08 kg/m  No LMP recorded (lmp unknown). (Menstrual status: IUD).  General:   Alert,  Well-developed, well-nourished, pleasant and cooperative in NAD Lungs:  Respirations even and unlabored.  Clear throughout to auscultation.   No wheezes, crackles, or rhonchi. No acute distress. Heart:  Regular rate and rhythm; no murmurs, clicks, rubs, or gallops. Abdomen:  Normal bowel sounds.  No bruits.  Soft, and non-distended without masses, hepatosplenomegaly or hernias noted.  No Tenderness.  No guarding or rebound tenderness.    Rectal/anoscopy: There is a small external skin tag at the 6 o'clock position of anus.  No other external hemorrhoids are visible.  No evidence of perianal abscess or fissure.  Internal exam shows grade 1 nonprolapsing internal hemorrhoids which are not bleeding.  No rectal masses.  Mild tenderness during anoscopy.  Imaging Studies: No results found.  Assessment and Plan:   Shelley Foster is a 22 y.o. y/o  female has been referred for:  Hemorrhoids -currently improved.  Discussed treatment for hemorrhoids at length.  For External Hemorrhoids: Warm water sitz bath with epsom salt for flare up of external hemorrhoids. Use OTC Preparation H, Tucks Pads, and Witch Hazel wipes as needed. Surgery is not recommended or indicated.  For Internal Hemorrhoids: Rx Hydrocortisone Suppositories 25mg  Insert 1 into rectum once daily at bedtime for 10-14 days. Stressed importance of treating underlying constipation. Avoid Sitting on the toilet for prolonged amount of time. Discussed Internal Hemorrhoid Banding if no improvement with conservative treament.   Constipation Discussed constipation treatment at length. Recommend High Fiber diet with fruits, vegetables, and whole grains. Drink 64 ounces of Fluids  Daily. Start Miralax Mix 1 capful in a drink daily. Start Benefiber Mix 1 TBSP in a drink daily. Start Colace (docusate sodium) 100mg  1 capsule once daily. If no improvement, consider Linzess, Amitiza or Trulance.   Rectal Bleeding - Inactive at present; Mild and intermittent attributed to hemorrhoids. I recommend patient schedule a colonoscopy, however she adamantly declined.  If her bleeding returns or worsens, she states she will follow-up to schedule colonoscopy.  Follow up as needed if recurrent or worsening symptoms.  Celso [name], PA-C

## 2023-04-13 ENCOUNTER — Ambulatory Visit: Payer: BC Managed Care – PPO | Admitting: Obstetrics and Gynecology

## 2023-04-26 ENCOUNTER — Ambulatory Visit (INDEPENDENT_AMBULATORY_CARE_PROVIDER_SITE_OTHER): Payer: BC Managed Care – PPO | Admitting: Obstetrics and Gynecology

## 2023-04-26 ENCOUNTER — Ambulatory Visit: Payer: BC Managed Care – PPO | Admitting: Obstetrics and Gynecology

## 2023-04-26 ENCOUNTER — Encounter: Payer: Self-pay | Admitting: Obstetrics and Gynecology

## 2023-04-26 VITALS — BP 118/72 | HR 86 | Wt 149.0 lb

## 2023-04-26 DIAGNOSIS — Z30433 Encounter for removal and reinsertion of intrauterine contraceptive device: Secondary | ICD-10-CM | POA: Diagnosis not present

## 2023-04-26 DIAGNOSIS — Z975 Presence of (intrauterine) contraceptive device: Secondary | ICD-10-CM

## 2023-04-26 DIAGNOSIS — Z01812 Encounter for preprocedural laboratory examination: Secondary | ICD-10-CM

## 2023-04-26 LAB — PREGNANCY, URINE: Preg Test, Ur: NEGATIVE

## 2023-04-26 NOTE — Progress Notes (Signed)
22 y.o. G0P0000 female here for IUD removal and reinsertion, type: Mirena. Presents with boyfriend, Clayburn Pert.  Current contraception:  Mirena  Last GC/CT testing date 03/20/23.  Result: neg  No LMP recorded. (Menstrual status: IUD).    GYN HISTORY: No significant history  OB History  Gravida Para Term Preterm AB Living  0 0 0 0 0 0  SAB IAB Ectopic Multiple Live Births  0 0 0 0     Past Medical History:  Diagnosis Date   Acne    (Dr. Nino Parsley practice)   Anxiety    Depression    Dizziness 03/2014   orthostatic hypotension--[facility] eval Dr. Clent Ridges   Dysmenorrhea    Gastritis 10/28/2013   chronic gastritis noted on bx from EGD   GERD (gastroesophageal reflux disease)    ([facility])   Migraine headache 5th or 6th grade   sees Elveria Rising   Overdose of medication    POTS (postural orthostatic tachycardia syndrome) 2015   Vulvodynia     Past Surgical History:  Procedure Laterality Date   INTRAUTERINE DEVICE (IUD) INSERTION  10/2015   Mirena   LAPAROSCOPIC CHOLECYSTECTOMY  04/21/14   Dr. Dell Ponto ([facility])    Current Outpatient Medications on File Prior to Visit  Medication Sig Dispense Refill   adapalene (DIFFERIN) 0.1 % cream Apply topically at bedtime. 45 g 1   fludrocortisone (FLORINEF) 0.1 MG tablet Take 0.05 mg by mouth daily.     hydrocortisone (ANUSOL-HC) 25 MG suppository Place 1 suppository (25 mg total) rectally at bedtime for 24 days. 12 suppository 1   JORNAY PM 100 MG CP24 Take 1 capsule by mouth at bedtime.     levonorgestrel (MIRENA) 20 MCG/24HR IUD 1 each by Intrauterine route once.     methylphenidate (METADATE CD) 40 MG CR capsule Take 40 mg by mouth every morning.     Multiple Vitamins-Minerals (MULTIVITAMIN WITH MINERALS) tablet Take 1 tablet by mouth daily.     naproxen sodium (ANAPROX DS) 550 MG tablet 1 tab po q 12 hours prn pain 30 tablet 2   omeprazole (PRILOSEC) 20 MG capsule Take 1 capsule (20 mg total) by mouth 2 (two) times daily before a  meal. 60 capsule 0   ondansetron (ZOFRAN-ODT) 4 MG disintegrating tablet Take 4 mg by mouth every 6 (six) hours as needed.     oral electrolytes (THERMOTABS) TABS tablet Take by mouth.     Probiotic Product (PROBIOTIC DAILY PO) Take 1 capsule by mouth daily.     propranolol (INDERAL) 10 MG tablet Take 10 mg by mouth 2 (two) times daily.     rizatriptan (MAXALT) 5 MG tablet Take 5 mg by mouth daily as needed.     spironolactone (ALDACTONE) 25 MG tablet Take 1 tablet (25 mg total) by mouth at bedtime. 90 tablet 3   No current facility-administered medications on file prior to visit.    Allergies  Allergen Reactions   Tilactase Nausea Only      PE Today's Vitals   04/26/23 1503  BP: 118/72  Pulse: 86  SpO2: 98%  Weight: 149 lb (67.6 kg)   Body mass index is 26.39 kg/m.  Physical Exam Vitals reviewed. Exam conducted with a chaperone present.  Constitutional:      General: She is not in acute distress.    Appearance: Normal appearance.  HENT:     Head: Normocephalic and atraumatic.     Nose: Nose normal.  Eyes:     Extraocular Movements:  Extraocular movements intact.     Conjunctiva/sclera: Conjunctivae normal.  Pulmonary:     Effort: Pulmonary effort is normal.  Genitourinary:    General: Normal vulva.     Exam position: Lithotomy position.     Vagina: Normal. No vaginal discharge.     Cervix: Normal. No cervical motion tenderness, discharge or lesion.     Uterus: Normal. Not enlarged and not tender.      Adnexa: Right adnexa normal and left adnexa normal.     Comments: IUD strings present Musculoskeletal:        General: Normal range of motion.     Cervical back: Normal range of motion.  Neurological:     General: No focal deficit present.     Mental Status: She is alert.  Psychiatric:        Mood and Affect: Mood normal.        Behavior: Behavior normal.     Procedure:  Speculum inserted.  2% glydo (Lot [id number], Exp 2026-06) applied to cervix. Speculum  removed, and patient given at least 10 mins prior to proceeding with procedure. Speculum was then re-inserted.  Cervix visualized and cleansed with Betadine x 3.  Tenaculum placed on anterior cervix. Mirena IUD was removed without difficulty. Then uterus sounded to 6 cm.  Mirena IUD (Lot#: [id number], Exp 2026-12) could not be passed into the uterus due to cervical resistance.  The cervix was then dilated using an os finder without difficulty.  An attempt was made to reinsert the Mirena IUD, however due to significant pain and inability to completely insert the IUD, the procedure was discontinued.  Minimal bleeding noted.  All instruments were removed from the vagina.   Assessment and Plan:        Pre-procedure lab exam -     Pregnancy, urine  Uses hormone releasing intrauterine device (IUD) for contraception -     IUD Insertion  IUD removed without difficulty, however unsuccessful Mirena IUD insertion Recommend IUD placement under ultrasound guidance. Reviewed 2019 transvaginal ultrasound with 7 cm uterus with normal shape. Consider paracervical block with next IUD insertion  Rosalyn Gess, MD

## 2023-04-26 NOTE — Patient Instructions (Signed)
It is common to have vaginal bleeding and cramping for up to 72 hours. Please call our office with heavy vaginal bleeding, severe abdominal pain or fever.

## 2023-05-03 ENCOUNTER — Encounter: Payer: Self-pay | Admitting: Genetic Counselor

## 2023-05-03 ENCOUNTER — Inpatient Hospital Stay: Payer: BC Managed Care – PPO | Attending: Genetic Counselor | Admitting: Genetic Counselor

## 2023-05-03 ENCOUNTER — Inpatient Hospital Stay: Payer: BC Managed Care – PPO

## 2023-05-03 ENCOUNTER — Other Ambulatory Visit: Payer: Self-pay | Admitting: Genetic Counselor

## 2023-05-03 DIAGNOSIS — Z803 Family history of malignant neoplasm of breast: Secondary | ICD-10-CM | POA: Diagnosis not present

## 2023-05-03 DIAGNOSIS — Z1379 Encounter for other screening for genetic and chromosomal anomalies: Secondary | ICD-10-CM

## 2023-05-03 DIAGNOSIS — Z8481 Family history of carrier of genetic disease: Secondary | ICD-10-CM | POA: Diagnosis not present

## 2023-05-03 LAB — GENETIC SCREENING ORDER

## 2023-05-04 ENCOUNTER — Encounter: Payer: Self-pay | Admitting: Genetic Counselor

## 2023-05-04 NOTE — Progress Notes (Signed)
 REFERRING PROVIDER: Randol Dawes, MD [address] [place],  [place] [address]  PRIMARY PROVIDER:  Randol Dawes, MD  PRIMARY REASON FOR VISIT:  1. Family history of gene mutation   2. Family history of breast cancer    HISTORY OF PRESENT ILLNESS:   Shelley Foster, a 23 y.o. female, was seen for a [facility] genetics consultation at the request of Dr. Randol due to a family history of cancer and a CHEK2 gene mutation in her mother and maternal grandmother.  Shelley Foster presents to clinic today to discuss the possibility of a hereditary predisposition to cancer, to discuss genetic testing, and to further clarify her future cancer risks, as well as potential cancer risks for family members. Shelley Foster mother briefly joined the session by telephone to provide verbal consent to access her chart and discuss her genetic test results with Shelley Foster.  Shelley Foster is a 23 y.o. female with no personal history of cancer.   RISK FACTORS:  Menarche was at age 22.  First live birth at age n/a.  OCP use for approximately 6 months  Ovaries intact: yes.  Uterus intact: yes.  Menopausal status: premenopausal.  HRT use: 0 years. Colonoscopy: no Mammogram within the last year: no. Number of breast biopsies: 0. Up to date with pelvic exams: yes. Any excessive radiation exposure in the past: no  Past Medical History:  Diagnosis Date   Acne    (Dr. [facility] practice)   Anxiety    Depression    Dizziness 03/2014   orthostatic hypotension--[facility] eval Dr[facility] Hope   Dysmenorrhea    Gastritis 10/28/2013   chronic gastritis noted on bx from EGD   GERD (gastroesophageal reflux disease)    ([facility])   Migraine headache 5th or 6th grade   sees Ellouise Bollman   Overdose of medication    POTS (postural orthostatic tachycardia syndrome) 2015   Vulvodynia     Past Surgical History:  Procedure Laterality Date   INTRAUTERINE DEVICE (IUD) INSERTION  10/2015   Mirena    LAPAROSCOPIC  CHOLECYSTECTOMY  04/21/14   Dr. Yasmin ([facility])    Social History   Socioeconomic History   Marital status: Single    Spouse name: Not on file   Number of children: Not on file   Years of education: Not on file   Highest education level: Not on file  Occupational History   Not on file  Tobacco Use   Smoking status: Every Day    Types: E-cigarettes   Smokeless tobacco: Never  Vaping Use   Vaping status: Every Day  Substance and Sexual Activity   Alcohol use: Yes    Comment: Rare   Drug use: Yes    Types: Marijuana   Sexual activity: Yes    Partners: Male    Birth control/protection: I.U.D.  Other Topics Concern   Not on file  Social History Narrative   Carra graduated from [employer]   She now attends [employer]    Lives with mom, dad, younger sister and two dogs.   Social Drivers of Corporate Investment Banker Strain: Not on file  Food Insecurity: Not on file  Transportation Needs: Not on file  Physical Activity: Not on file  Stress: Not on file  Social Connections: Not on file     FAMILY HISTORY:  We obtained a detailed, 4-generation family history.  Significant diagnoses are listed below: Family History  Problem Relation Age of Onset   Migraines  Mother    Other Mother        CHEK2+ (c.1100delC)   Breast cancer Maternal Aunt 5       Myriad Myrisk 25 gene Negative   Breast cancer Maternal Grandmother 30       CHEK2+     Shelley Foster's mother had genetic testing through [facility] in 2018 (47 genes) and a mutation was identified in the CHEK2 gene (c.1100delC). This report has been confirmed. Her maternal aunt was diagnosed with breast cancer at age 49, she had negative genetic testing through [facility] (25 genes). This report was also confirmed. Her maternal grandmother was diagnosed with breast cancer at age 52, she reportedly has a CHEK2 gene mutation (we do not have a report to confirm). There is no reported Ashkenazi  Jewish ancestry.   GENETIC COUNSELING ASSESSMENT: Shelley Foster is a 22 y.o. female with a family history of a CHEK2 gene mutation. Therefore, we discussed and recommended the following at today's visit.   DISCUSSION:  We discussed that 5 - 10% of cancer is hereditary, with most cases of hereditary breast cancer associated with BRCA1/2.  There are other genes that can be associated with hereditary breast cancer syndromes including CHEK2.  We discussed that testing is beneficial for several reasons, including knowing about other cancer risks and identifying potential screening and risk-reduction options that may be appropriate. We spent the majority of today's session reviewing cancer risks and management recommendations for individuals who have a CHEK2 gene mutation with a focus on management in females as noted below.   Cancer Risks for CHEK2: Women have a 23-27% lifetime risk of breast cancer. Men are thought to be at an increased risk of prostate cancer. The exact risk figure is unknown at this time. No increased risk for colon cancer based on mutation alone.   Breast Cancer Screening/Risk Reduction:  Women: Breast cancer screening includes: Breast awareness beginning at age 39 Monthly self-breast examination beginning at age 44 Clinical breast examination every 6-12 months beginning at age 42 or at the age of the earliest diagnosed breast cancer in the family, if onset was before age 69 Annual mammogram with consideration of tomosynthesis starting at age 79 or 10 years prior to the youngest age of diagnosis, whichever comes first Consider breast MRI with contrast starting at age 54-35 Evidence is insufficient for a prophylactic risk-reducing mastectomy, manage based on family history  For patients who are treated for breast cancer and have not had bilateral mastectomy, screening should continue as described  Implications for Family Members: Hereditary predisposition to cancer due to  pathogenic variants in the CHEK2 gene has autosomal dominant inheritance. This means that an individual with a pathogenic variant has a 50% chance of passing the condition on to his/her offspring.   Additional Information Discussed: We discussed the option to order single site testing for her mother's CHEK2 gene mutation. Since we have her mother's report to confirm the mutation, can see her mother had comprehensive genetic testing (47 genes), and there is no paternal family history of cancer, we recommended CHEK2 single site testing. However, Shelley Foster requested a gene panel. We also discussed the implications of a negative, positive, carrier and/or variant of uncertain significant result.   Since Shelley Foster requested a gene panel rather than single site testing, she was offered a common hereditary cancer panel (39 genes) and an expanded pan-cancer panel (76 genes). Shelley Foster was informed of the benefits and limitations of each panel, including that expanded  pan-cancer panels contain several genes that do not have clear management guidelines at this point in time.  We also discussed that as the number of genes included on a panel increases, the chances of variants of uncertain significance increases.  After considering the benefits and limitations of each gene panel, Shelley Foster elected to have [facility] CancerNext-Expanded Panel.   The CancerNext-Expanded gene panel offered by [facility] and includes sequencing, rearrangement, and RNA analysis for the following 76 genes: AIP, ALK, APC, ATM, AXIN2, BAP1, BARD1, BMPR1A, BRCA1, BRCA2, BRIP1, CDC73, CDH1, CDK4, CDKN1B, CDKN2A, CEBPA, CHEK2, CTNNA1, DDX41, DICER1, ETV6, FH, FLCN, GATA2, LZTR1, MAX, MBD4, MEN1, MET, MLH1, MSH2, MSH3, MSH6, MUTYH, NF1, NF2, NTHL1, PALB2, PHOX2B, PMS2, POT1, PRKAR1A, PTCH1, PTEN, RAD51C, RAD51D, RB1, RET, RUNX1, SDHA, SDHAF2, SDHB, SDHC, SDHD, SMAD4, SMARCA4, SMARCB1, SMARCE1, STK11, SUFU, TMEM127, TP53, TSC1, TSC2, VHL, and  WT1 (sequencing and deletion/duplication); EGFR, HOXB13, KIT, MITF, PDGFRA, POLD1, and POLE (sequencing only); EPCAM and GREM1 (deletion/duplication only).    Based on Shelley Foster's family history of cancer, she meets medical criteria for genetic testing. Despite that she meets criteria, she may still have an out of pocket cost. We discussed that if her out of pocket cost for testing is over $100, the laboratory should contact them to discuss self-pay prices, patient pay assistance programs, if applicable, and other billing options.  We discussed that some people do not want to undergo genetic testing due to fear of genetic discrimination.  A federal law called the Genetic Information Non-Discrimination Act (GINA) of 2008 helps protect individuals against genetic discrimination based on their genetic test results.  It impacts both health insurance and employment.  With health insurance, it protects against increased premiums, being kicked off insurance or being forced to take a test in order to be insured.  For employment it protects against hiring, firing and promoting decisions based on genetic test results.  GINA does not apply to those in the [employer], those who work for companies with less than 15 employees, and new life insurance or long-term disability insurance policies.  Health status due to a cancer diagnosis is not protected under GINA.  PLAN: After considering the risks, benefits, and limitations, Shelley Foster provided informed consent to pursue genetic testing and the blood sample was sent to [facility] for analysis of the CancerNext-Expanded Panel. Results should be available within approximately 2-3 weeks' time, at which point they will be disclosed by telephone to Shelley Foster, as will any additional recommendations warranted by these results. Shelley Foster will receive a summary of her genetic counseling visit and a copy of her results once available. This information will also be  available in Epic.    Shelley Foster questions were answered to her satisfaction today. Our contact information was provided should additional questions or concerns arise. Thank you for the referral and allowing us  to share in the care of your patient.   Kyvon Hu, MS, [facility] Genetic Counselor [email or web address] ([phone]  60 minutes were spent on the date of the encounter in service to the patient including preparation, face-to-face consultation, documentation and care coordination. The patient was seen alone. Drs. Gudena and/or Lanny were available to discuss this case as needed.  _______________________________________________________________________ For Office Staff:  Number of people involved in session: 1 Was an Intern/ student involved with case: no

## 2023-05-15 ENCOUNTER — Other Ambulatory Visit: Payer: Self-pay | Admitting: Obstetrics and Gynecology

## 2023-05-15 DIAGNOSIS — Z30430 Encounter for insertion of intrauterine contraceptive device: Secondary | ICD-10-CM

## 2023-05-17 ENCOUNTER — Ambulatory Visit: Payer: BC Managed Care – PPO | Admitting: Physician Assistant

## 2023-05-17 ENCOUNTER — Ambulatory Visit (INDEPENDENT_AMBULATORY_CARE_PROVIDER_SITE_OTHER): Payer: BC Managed Care – PPO | Admitting: Obstetrics and Gynecology

## 2023-05-17 ENCOUNTER — Other Ambulatory Visit: Payer: BC Managed Care – PPO | Admitting: Obstetrics and Gynecology

## 2023-05-17 ENCOUNTER — Other Ambulatory Visit: Payer: BC Managed Care – PPO

## 2023-05-17 ENCOUNTER — Ambulatory Visit: Payer: BC Managed Care – PPO

## 2023-05-17 ENCOUNTER — Encounter: Payer: Self-pay | Admitting: Obstetrics and Gynecology

## 2023-05-17 VITALS — BP 100/68 | HR 93 | Wt 143.0 lb

## 2023-05-17 DIAGNOSIS — Z975 Presence of (intrauterine) contraceptive device: Secondary | ICD-10-CM | POA: Insufficient documentation

## 2023-05-17 DIAGNOSIS — Z01812 Encounter for preprocedural laboratory examination: Secondary | ICD-10-CM | POA: Diagnosis not present

## 2023-05-17 DIAGNOSIS — Z30430 Encounter for insertion of intrauterine contraceptive device: Secondary | ICD-10-CM | POA: Diagnosis not present

## 2023-05-17 DIAGNOSIS — L700 Acne vulgaris: Secondary | ICD-10-CM

## 2023-05-17 MED ORDER — LIDOCAINE HCL URETHRAL/MUCOSAL 2 % EX GEL: 1.0000 | Freq: Once | CUTANEOUS | Status: DC

## 2023-05-17 MED ORDER — LEVONORGESTREL 19.5 MG IU IUD: INTRAUTERINE_SYSTEM | Freq: Once | INTRAUTERINE | Status: AC

## 2023-05-17 NOTE — Progress Notes (Signed)
23 y.o. G0P0000 female with  family history of breast cancer (Mother +CHEK 2, s/p genetic testing January 202), POTS (see specialist in [place]) here for ultrasound-guided IUD insertion, type: Kyleena. Presents with boyfriend, Clayburn Pert x4 years. 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   Current contraception:  none  Last GC/CT testing date 03/20/23.  Result: neg  No LMP recorded. (Menstrual status: IUD).    GYN HISTORY: Hx of dysmenorrhea, dyspareunia, vulvodynia - resolved  OB History  Gravida Para Term Preterm AB Living  0 0 0 0 0 0  SAB IAB Ectopic Multiple Live Births  0 0 0 0     Past Medical History:  Diagnosis Date   Acne    (Dr. Nino Parsley practice)   Anxiety    Depression    Dizziness 03/01/2014   orthostatic hypotension--[facility] eval Dr[facility] Clent Ridges   Dysmenorrhea    Dyspareunia, female 12/16/2015   resolved     Gastritis 10/28/2013   chronic gastritis noted on bx from EGD   GERD (gastroesophageal reflux disease)    ([facility])   Migraine headache 5th or 6th grade   sees Elveria Rising   Overdose of medication    POTS (postural orthostatic tachycardia syndrome) 05/01/2013   Vulvodynia     Past Surgical History:  Procedure Laterality Date   INTRAUTERINE DEVICE (IUD) INSERTION  10/2015   Mirena   LAPAROSCOPIC CHOLECYSTECTOMY  04/21/14   Dr. Dell Ponto ([facility])    Current Outpatient Medications on File Prior to Visit  Medication Sig Dispense Refill   adapalene (DIFFERIN) 0.1 % cream Apply topically at bedtime. 45 g 1   fludrocortisone (FLORINEF) 0.1 MG tablet Take 0.05 mg by mouth daily.     JORNAY PM 100 MG CP24 Take 1 capsule by mouth at bedtime.     methylphenidate (METADATE CD) 40 MG CR capsule Take 40 mg by mouth every morning.     Multiple Vitamins-Minerals (MULTIVITAMIN WITH MINERALS) tablet Take 1 tablet by mouth daily.     naproxen sodium (ANAPROX DS) 550 MG tablet 1 tab po q 12 hours prn pain 30 tablet 2   omeprazole (PRILOSEC) 20 MG capsule  Take 1 capsule (20 mg total) by mouth 2 (two) times daily before a meal. 60 capsule 0   ondansetron (ZOFRAN-ODT) 4 MG disintegrating tablet Take 4 mg by mouth every 6 (six) hours as needed.     oral electrolytes (THERMOTABS) TABS tablet Take by mouth.     Probiotic Product (PROBIOTIC DAILY PO) Take 1 capsule by mouth daily.     propranolol (INDERAL) 10 MG tablet Take 10 mg by mouth 2 (two) times daily.     rizatriptan (MAXALT) 5 MG tablet Take 5 mg by mouth daily as needed.     spironolactone (ALDACTONE) 25 MG tablet Take 1 tablet (25 mg total) by mouth at bedtime. 90 tablet 3   No current facility-administered medications on file prior to visit.    Allergies  Allergen Reactions   Tilactase Nausea Only      PE Today's Vitals   05/17/23 1526  BP: 100/68  Pulse: 93  SpO2: 98%  Weight: 143 lb (64.9 kg)   Body mass index is 25.33 kg/m.  Physical Exam Vitals reviewed. Exam conducted with a chaperone present.  Constitutional:      General: She is not in acute distress.    Appearance: Normal appearance.  HENT:     Head: Normocephalic and atraumatic.     Nose: Nose normal.  Eyes:  Extraocular Movements: Extraocular movements intact.     Conjunctiva/sclera: Conjunctivae normal.  Pulmonary:     Effort: Pulmonary effort is normal.  Genitourinary:    General: Normal vulva.     Exam position: Lithotomy position.     Vagina: Normal. No vaginal discharge.     Cervix: Normal. No cervical motion tenderness, discharge or lesion.     Uterus: Normal. Not enlarged and not tender.      Adnexa: Right adnexa normal and left adnexa normal.  Musculoskeletal:        General: Normal range of motion.     Cervical back: Normal range of motion.  Neurological:     General: No focal deficit present.     Mental Status: She is alert.  Psychiatric:        Mood and Affect: Mood normal.        Behavior: Behavior normal.     Procedure: Preprocedure ultrasound images were reviewed.  Speculum  inserted.  2% glydo (Lot [id number], Exp 2026-10) applied to cervix. Speculum removed, and patient given at least 10 mins prior to proceeding with procedure. Speculum was then re-inserted.  Cervix visualized and cleansed with Betadine x 3.  Tenaculum placed on anterior cervix.  The uterus sounded to 7 cm.  Kyleena IUD (Lot#: [id number], Exp 2026-11) was inserted according to manufacturer's recommendations under US-guidance.  Tenaculum was removed.  IUD strings were trimmed to 3 cm.  Minimal bleeding noted.  All instruments were removed from the vagina.  05/17/2023 TVUS: Indications: US-guided IUD insertion  Findings:   Uterus: 6.6 x 3.4 x 3.0 cm, anteverted. Endometrial thickness: 4.9 mm. Left ovary: 3.2 x 1.9 x 1.8 cm, normal-appearing. Right ovary: 3.0 x 1.7 x 1.1 cm, normal-appearing. No free fluid.  Ultrasound-guided insertion of Kyleena IUD without complications.  Postprocedure IUD placement confirmed on transvaginal ultrasound and with 3D imaging.  Impression:  Normal pelvic anatomy. Successful placement of Kyleena IUD under ultrasound guidance.  Rosalyn Gess, MD   Assessment and Plan:        Encounter for IUD insertion Recommended Kyleena given prior difficult insertion and 6cm uterine length. Patient in agreement. Instructions and precautions given.  Follow up in 4-6 weeks, sooner as needed. IUD product card given to patient with date of removal and lot number. -     lidocaine -     Levonorgestrel  Acne vulgaris Assessment & Plan: Previously improved with COC However now using IUD Spironolactone trial started 03/20/23 Reviewed diuretic that may worsen POTS symptoms. Takes  electrolyte tabs Also reviewed importance of reliable contraception Requires BP and BMP surveillance within 1 wk of initiation and then 2-4wk later.    Orders: -     Basic metabolic panel   Rosalyn Gess, MD

## 2023-05-17 NOTE — Assessment & Plan Note (Signed)
Previously improved with COC However now using IUD Spironolactone trial started 03/20/23 Reviewed diuretic that may worsen POTS symptoms. Takes  electrolyte tabs Also reviewed importance of reliable contraception Requires BP and BMP surveillance within 1 wk of initiation and then 2-4wk later.

## 2023-05-17 NOTE — Patient Instructions (Addendum)
Intrauterine Device Insertion An intrauterine device (IUD) is a medical device that is inserted into the uterus to prevent pregnancy. It is a small, T-shaped device that has one or two nylon strings hanging down from it. The strings hang out of the lower part of the uterus (cervix) to allow for future IUD removal. There are two types of IUDs: Hormone IUD. This type of IUD is made of plastic and contains the hormone progestin (synthetic progesterone). A hormone IUD may last 3-5 years, depending on which one you have. Synthetic progesterone prevents pregnancy by: Thickening cervical mucus to prevent sperm from entering the uterus. Thinning the uterine lining to prevent a fertilized egg from implanting there. Copper IUD. This type of IUD has copper wire wrapped around it. A copper IUD may last up to 10 years. Copper prevents pregnancy by making the uterus and fallopian tubes produce a fluid that kills sperm. Tell a health care provider about: Any allergies you have. All medicines you are taking, including vitamins, herbs, eye drops, creams, and over-the-counter medicines. Any surgeries you have had. Any medical conditions you have, including any sexually transmitted infections (STIs) you may have. Whether you are pregnant or may be pregnant. What are the risks? Generally, this is a safe procedure. However, problems may occur, including: Infection. Bleeding. Allergic reactions to medicines. Puncture (perforation) of the uterus or damage to other structures or organs. Accidental placement of the IUD either in the muscle layer of the uterus (myometrium) or outside the uterus. The IUD falling out of the uterus (expulsion). This is more common among women who have recently had a child. Higher risk of an egg being fertilized outside your uterus (ectopic pregnancy).This is rare. Pelvic inflammatory disease (PID), which is an infection in the uterus and fallopian tubes. The IUD does not cause the  infection. The infection is usually from an unknown sexually transmitted infection (STI). This is rare, and it usually happens during the first 20 days after the IUD is inserted. What happens before the procedure? Ask your health care provider about: Changing or stopping your regular medicines. This is especially important if you are taking diabetes medicines or blood thinners. Taking over-the-counter medicines, vitamins, herbs, and supplements. Talk with your health care provider about when to schedule your IUD placement. Your health care provider may recommend taking over-the-counter pain medicines before the procedure. These medicines include ibuprofen and naproxen. You may have tests for: Pregnancy. A pregnancy test involves having a urine or blood sample taken. Sexually transmitted infections (STIs). Placing an IUD in someone who has an STI can make the infection worse. Cervical cancer. You may have a Pap test to check for this type of cancer. This means collecting cells from your cervix to be checked under a microscope. You may have a physical exam to determine the size and position of your uterus. What happens during the procedure? A tool (speculum) will be placed in your vagina and widened so that your health care provider can see your cervix. Medicine, or antiseptic, may be applied to your cervix to help lower your risk of infection. You may be given an anesthetic medicine to numb each side of your cervix. This medicine is usually given by an injection into the cervix. A tool called a uterine sound will be inserted into your uterus to check the length of your uterus and the direction that your uterus may be tilted. A slim instrument or tube (IUD inserter) that holds the IUD will be inserted into your vagina,  through your cervical canal, and into your uterus. The IUD will be placed in the uterus, and the IUD inserter will be removed. The strings that are attached to the IUD will be trimmed  so that they lie just below the cervix. The speculum will be removed. The procedure may vary among health care providers and hospitals. What can I expect after procedure? You may have bleeding after the procedure. This is normal. It varies from light bleeding (spotting) for a few days to menstrual-like bleeding. You may have cramping and pain in the abdomen. You may feel dizzy or light-headed. You may have lower back pain. You may have headaches and nausea. Follow these instructions at home: Before resuming sexual activity, check to make sure that you can feel the IUD string or strings. You should be able to feel the end of the string below the opening of your cervix. If your IUD string is in place, you may resume sexual activity. If you had a hormonal IUD inserted more than 7 days after your most recent period started, you will need to use a backup method of birth control for 7 days after IUD insertion. Ask your health care provider whether this applies to you. Continue to check that the IUD is still in place by feeling for the strings after every menstrual period, or once a month. An IUD will not protect you from sexually transmitted infections (STIs). Use methods to prevent the exchange of body fluids between partners (barrier protection) every time you have sex. Barrier protection can be used during oral, vaginal, or anal sex. Commonly used barrier methods include: Female condom. Female condom. Dental dam. Take over-the-counter and prescription medicines only as told by your health care provider. Keep all follow-up visits. This is important. Contact a health care provider if: You feel light-headed or weak. You have any of the following problems with your IUD string or strings: The string bothers or hurts you or your sexual partner. You cannot feel the string. The string has gotten longer. You can feel the IUD in your vagina. You think you may be pregnant, or you miss your menstrual  period. You think you may have a sexually transmitted infection (STI). Get help right away if you: You have flu-like symptoms, such as tiredness (fatigue) and muscle aches. You have a fever and chills. You have bleeding that is heavier or lasts longer than a normal menstrual cycle. You have abnormal or bad-smelling discharge from your vagina. You develop abdominal pain that is new, is getting worse, or is not in the same area of earlier cramping and pain. You have pain during sexual activity. Summary An intrauterine device (IUD) is a small, T-shaped device that has one or two nylon strings hanging down from it. You may have a copper IUD or a hormone IUD. Ask your health care provider what you need to do before the procedure. You may have some tests and you may have to change or stop some medicines. You may have bleeding after the procedure. This is normal. It varies from light spotting for a few days to menstrual-like bleeding. Check to make sure that you can feel the IUD strings before you resume sexual activity. Check the strings after every menstrual period or once a month. An IUD does not protect against STIs. Use other methods to protect yourself against infections. This information is not intended to replace advice given to you by your health care provider. Make sure you discuss any questions you have with  your health care provider. Document Revised: 10/29/2019 Document Reviewed: 10/29/2019 Elsevier Patient Education  2024 [employer].

## 2023-05-18 ENCOUNTER — Encounter: Payer: Self-pay | Admitting: Obstetrics and Gynecology

## 2023-05-18 LAB — BASIC METABOLIC PANEL
BUN: 8 mg/dL (ref 7–25)
CO2: 23 mmol/L (ref 20–32)
Calcium: 9.5 mg/dL (ref 8.6–10.2)
Chloride: 102 mmol/L (ref 98–110)
Creat: 0.79 mg/dL (ref 0.50–0.96)
Glucose, Bld: 75 mg/dL (ref 65–99)
Potassium: 3.8 mmol/L (ref 3.5–5.3)
Sodium: 138 mmol/L (ref 135–146)

## 2023-05-18 LAB — PREGNANCY, URINE: Preg Test, Ur: NEGATIVE

## 2023-05-18 NOTE — Addendum Note (Signed)
Addended by: Lenoard Aden on: 05/18/2023 09:42 AM   Modules accepted: Orders

## 2023-05-21 ENCOUNTER — Encounter: Payer: Self-pay | Admitting: Genetic Counselor

## 2023-05-21 ENCOUNTER — Encounter: Payer: Self-pay | Admitting: Obstetrics and Gynecology

## 2023-05-21 ENCOUNTER — Telehealth: Payer: Self-pay | Admitting: Genetic Counselor

## 2023-05-21 DIAGNOSIS — Z1589 Genetic susceptibility to other disease: Secondary | ICD-10-CM | POA: Insufficient documentation

## 2023-05-21 DIAGNOSIS — Z1379 Encounter for other screening for genetic and chromosomal anomalies: Secondary | ICD-10-CM | POA: Insufficient documentation

## 2023-05-21 HISTORY — DX: Genetic susceptibility to other disease: Z15.89

## 2023-05-21 NOTE — Telephone Encounter (Signed)
Contacted patient in attempt to disclose results of genetic testing.  LVM with contact information requesting a call back.  

## 2023-05-22 NOTE — Telephone Encounter (Signed)
Second attempt to contact patient to discuss results.  LVM requesting CB.

## 2023-05-24 ENCOUNTER — Ambulatory Visit: Payer: Self-pay | Admitting: Genetic Counselor

## 2023-05-24 DIAGNOSIS — Z8481 Family history of carrier of genetic disease: Secondary | ICD-10-CM

## 2023-05-24 DIAGNOSIS — Z1379 Encounter for other screening for genetic and chromosomal anomalies: Secondary | ICD-10-CM

## 2023-05-24 DIAGNOSIS — Z803 Family history of malignant neoplasm of breast: Secondary | ICD-10-CM

## 2023-05-24 DIAGNOSIS — Z1589 Genetic susceptibility to other disease: Secondary | ICD-10-CM

## 2023-05-24 NOTE — Progress Notes (Signed)
GENETIC TEST RESULTS  Patient Name: Shelley Foster Patient Age: 23 y.o. Encounter Date: 05/24/2023  Referring Provider: Darrell Jewel, MD  Shelley Foster was seen in the Cancer Genetics clinic on May 03, 2023 due to a maternal family history of cancer and concern regarding a hereditary predisposition to cancer in the family. Please refer to the prior Genetics clinic note for more information regarding Shelley Foster's medical and family histories and our assessment at the time.   Genetic testing result were disclosed to Shelley Foster by telephone.   Oncology History   No history exists.    FAMILY HISTORY:  We obtained a detailed, 4-generation family history.  Significant diagnoses are listed below:      Family History  Problem Relation Age of Onset   Migraines Mother     Other Mother          CHEK2+ (c.1100delC)   Breast cancer Maternal Aunt 54        Myriad Myrisk 25 gene Negative   Breast cancer Maternal Grandmother 56        CHEK2+       Shelley Foster mother had genetic testing through [facility] in 2018 (47 genes) and a mutation was identified in the CHEK2 gene (c.1100delC). This report has been confirmed. Her maternal aunt was diagnosed with breast cancer at age 50, she had negative genetic testing through [facility] Panel (25 genes). This report was also confirmed. Her maternal grandmother was diagnosed with breast cancer at age 40, she reportedly has a CHEK2 gene mutation (we do not have a report to confirm). There is no reported Ashkenazi Jewish ancestry.     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 Specifically, this variant is c.1100delC ([id number]).  This is the same CHEK2 mutation that was previously detected in her mother.   No other pathogenic variants were detected in the CancerNext-Expanded +RNAinsight panel.  The CancerNext-Expanded gene panel offered by [facility] and includes sequencing,  rearrangement, and RNA analysis for the following 76 genes: AIP, ALK, APC, ATM, AXIN2, BAP1, BARD1, BMPR1A, BRCA1, BRCA2, BRIP1, CDC73, CDH1, CDK4, CDKN1B, CDKN2A, CEBPA, CHEK2, CTNNA1, DDX41, DICER1, ETV6, FH, FLCN, GATA2, LZTR1, MAX, MBD4, MEN1, MET, MLH1, MSH2, MSH3, MSH6, MUTYH, NF1, NF2, NTHL1, PALB2, PHOX2B, PMS2, POT1, PRKAR1A, PTCH1, PTEN, RAD51C, RAD51D, RB1, RET, RUNX1, SDHA, SDHAF2, SDHB, SDHC, SDHD, SMAD4, SMARCA4, SMARCB1, SMARCE1, STK11, SUFU, TMEM127, TP53, TSC1, TSC2, VHL, and WT1 (sequencing and deletion/duplication); EGFR, HOXB13, KIT, MITF, PDGFRA, POLD1, and POLE (sequencing only); EPCAM and GREM1 (deletion/duplication only).   The test report has been scanned into EPIC and is located under the Molecular Pathology section of the Results Review tab.  A portion of the result report is included below for reference. Genetic testing reported out on May 18, 2023.    Cancer Risks for CHEK2: Females have a 23-27% lifetime risk of breast cancer. For females with a history of breast cancer: 10 year cumulative risk for contralateral breast cancer is 6-8%. Males are thought to be at an increased risk of prostate cancer. The exact risk figure is unknown at this time. No increased risk for colon cancer based on mutation alone.  Research is continuing to help learn more about the cancers associated with CHEK2 pathogenic variants and what the exact risks are to develop these cancers.  Management Recommendations:  Breast Cancer Screening/Risk Reduction: Females: Breast cancer screening includes: Breast awareness beginning at age 86 Monthly self-breast examination beginning  at age 30 Clinical breast examination every 6-12 months beginning at age 8 or at the age of the earliest diagnosed breast cancer in the family, if onset was before age 62 Annual mammogram with consideration of tomosynthesis starting at age 93 or 10 years prior to the youngest age of diagnosis, whichever comes  first Consider breast MRI with contrast starting at age 75-35 Evidence is insufficient for a prophylactic risk-reducing mastectomy, manage based on family history   Colon Cancer Screening: General population screening is appropriate Manage based on personal and family history   Prostate Cancer Screening: Consider prostate cancer screening beginning at age 28   This information is based on current understanding of the gene and may change in the future.   Implications for Family Members: Hereditary predisposition to cancer due to pathogenic variants in the CHEK2 gene has autosomal dominant inheritance. This means that an individual with a pathogenic variant has a 50% chance of passing the condition on to his/her offspring. Identification of a pathogenic variant allows for the recognition of at-risk relatives who can pursue testing for the familial variant.   Family members are encouraged to consider genetic testing for this familial pathogenic variant. As there are generally no childhood cancer risks associated with single pathogenic variants in the CHEK2 gene, individuals in the family are not recommended to have testing until they reach at least 24 years of age. Complimentary testing for the familial variant through [facility] is available for 90 days after the report date. They may contact our office at [phone] for more information or to schedule an appointment. Family members who live outside of the area are encouraged to find a genetic counselor in their area by visiting: [email or web address].   Resources: FORCE (Facing Our Risk of Cancer Empowered) is a resource for those with a hereditary predisposition to develop cancer.  FORCE provides information about risk reduction, advocacy, legislation, and clinical trials.  Additionally, FORCE provides a platform for collaboration and support; which includes: peer navigation, message boards, local support groups, a  toll-free helpline, research registry and recruitment, advocate training, published medical research, webinars, brochures, mastectomy photos, and more.  For more information, visit www.facingourrisk.org  Plan:  Continue breast awareness/self breast exams and physical exams.  Recommended she speak with her GYN about the high risk breast screening guidelines above, such as starting breast MRIs at age 29.  Encouraged genetic testing for appropriate maternal relatives, especially her sister.   Our contact number was provided. Ms. Romig questions were answered to her satisfaction, and she knows she is welcome to call us at anytime with additional questions or concerns.   Altie Savard M. Rennie Plowman, MS, [facility] Certified Education officer, [email or web address] (P) [phone]

## 2023-05-24 NOTE — Telephone Encounter (Signed)
Disclosed CHEK2 positive result.  Reviewed cancer risks/management/family implications.  See detailed clinic note

## 2023-05-30 ENCOUNTER — Other Ambulatory Visit: Payer: Self-pay

## 2023-05-30 DIAGNOSIS — L700 Acne vulgaris: Secondary | ICD-10-CM

## 2023-05-30 MED ORDER — SPIRONOLACTONE 25 MG PO TABS: 25.0000 mg | ORAL_TABLET | Freq: Every day | ORAL | 3 refills | Status: AC

## 2023-05-30 NOTE — Telephone Encounter (Signed)
Med refill request:spironolactone Last AEX: 03/20/2023 Next AEX: not yet scheduled Last MMG (if hormonal med) n/a Refill authorized: Last Rx sent #90 with 3 refills on 03/20/2023 to [facility]. Please approve or deny as appropriate.

## 2023-06-28 ENCOUNTER — Ambulatory Visit (INDEPENDENT_AMBULATORY_CARE_PROVIDER_SITE_OTHER): Payer: BC Managed Care – PPO | Admitting: Obstetrics and Gynecology

## 2023-06-28 ENCOUNTER — Encounter: Payer: Self-pay | Admitting: Obstetrics and Gynecology

## 2023-06-28 VITALS — BP 98/60 | HR 86 | Temp 98.8°F | Wt 142.0 lb

## 2023-06-28 DIAGNOSIS — Z975 Presence of (intrauterine) contraceptive device: Secondary | ICD-10-CM

## 2023-06-28 NOTE — Progress Notes (Signed)
 23 y.o. G0P0000 female with  family history of breast cancer (Mother +CHEK 2, s/p genetic testing January 202), POTS (see specialist in [place]), Kyleena (inserted 05/17/23) presents for string check. Presents with boyfriend, Clayburn Pert x4 years. 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   Doing well. No complaints. Had a light period after IUD insertion.  No LMP recorded. (Menstrual status: IUD).    GYN HISTORY: Hx of dysmenorrhea, dyspareunia, vulvodynia - resolved  OB History  Gravida Para Term Preterm AB Living  0 0 0 0 0 0  SAB IAB Ectopic Multiple Live Births  0 0 0 0     Past Medical History:  Diagnosis Date   Acne    (Dr. 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 Clent Ridges   Dysmenorrhea    Dyspareunia, female 12/16/2015   resolved     Gastritis 10/28/2013   chronic gastritis noted on bx from EGD   GERD (gastroesophageal reflux disease)    ([facility])   Migraine headache 5th or 6th grade   sees El[facility]ia [facility]   Overdose of medication    POTS (postural orthostatic tachycardia syndrome) 05/01/2013   Vulvodynia     Past Surgical History:  Procedure Laterality Date   INTRAUTERINE DEVICE (IUD) INSERTION  10/2015   Mirena   LAPAROSCOPIC CHOLECYSTECTOMY  04/21/14   Dr. Dell Ponto ([facility])    Current Outpatient Medications on File Prior to Visit  Medication Sig Dispense Refill   adapalene (DIFFERIN) 0.1 % cream Apply topically at bedtime. 45 g 1   fludrocortisone (FLORINEF) 0.1 MG tablet Take 0.05 mg by mouth daily.     JORNAY PM 100 MG CP24 Take 1 capsule by mouth at bedtime.     levonorgestrel (KYLEENA) 19.5 MG IUD 1 each by Intrauterine route once.     methylphenidate (METADATE CD) 40 MG CR capsule Take 40 mg by mouth every morning.     Multiple Vitamins-Minerals (MULTIVITAMIN WITH MINERALS) tablet Take 1 tablet by mouth daily.     naproxen sodium  (ANAPROX DS) 550 MG tablet 1 tab po q 12 hours prn pain 30 tablet 2   omeprazole (PRILOSEC) 20 MG capsule Take 1 capsule (20 mg total) by mouth 2 (two) times daily before a meal. 60 capsule 0   ondansetron (ZOFRAN-ODT) 4 MG disintegrating tablet Take 4 mg by mouth every 6 (six) hours as needed.     oral electrolytes (THERMOTABS) TABS tablet Take by mouth.     Probiotic Product (PROBIOTIC DAILY PO) Take 1 capsule by mouth daily.     propranolol (INDERAL) 10 MG tablet Take 10 mg by mouth 2 (two) times daily.     rizatriptan (MAXALT) 5 MG tablet Take 5 mg by mouth daily as needed.     spironolactone (ALDACTONE) 25 MG tablet Take 1 tablet (25 mg total) by mouth at bedtime. 90 tablet 3   No current facility-administered medications on file prior to visit.    Allergies  Allergen Reactions   Tilactase Nausea Only      PE Today's Vitals   06/28/23 1610  BP: 98/60  Pulse: 86  Temp: 98.8 F (37.1 C)  TempSrc: Oral  SpO2: 98%  Weight: 142 lb (64.4 kg)   Body mass index is 25.15 kg/m.  Physical Exam Vitals reviewed. Exam conducted with a chaperone present.  Constitutional:      General: She is  not in acute distress.    Appearance: Normal appearance.  HENT:     Head: Normocephalic and atraumatic.     Nose: Nose normal.  Eyes:     Extraocular Movements: Extraocular movements intact.     Conjunctiva/sclera: Conjunctivae normal.  Pulmonary:     Effort: Pulmonary effort is normal.  Genitourinary:    General: Normal vulva.     Exam position: Lithotomy position.     Vagina: Normal. No vaginal discharge.     Cervix: Normal. No cervical motion tenderness, discharge or lesion.     Uterus: Normal. Not enlarged and not tender.      Adnexa: Right adnexa normal and left adnexa normal.     Comments: IUD strings present. Musculoskeletal:        General: Normal range of motion.     Cervical back: Normal range of motion.  Neurological:     General: No focal deficit present.     Mental  Status: She is alert.  Psychiatric:        Mood and Affect: Mood normal.        Behavior: Behavior normal.      Assessment and Plan:        Uses hormone releasing intrauterine device (IUD) for contraception   Normal string check RTO for annual   Rosalyn Gess, MD
